# Patient Record
Sex: Female | Born: 1965
Health system: Southern US, Community
[De-identification: ages and names within clinical notes are randomized; demographics above are authoritative.]

## PROBLEM LIST (undated history)

## (undated) DIAGNOSIS — I10 Essential (primary) hypertension: Secondary | ICD-10-CM

## (undated) DIAGNOSIS — S066X9A Traumatic subarachnoid hemorrhage with loss of consciousness of unspecified duration, initial encounter: Secondary | ICD-10-CM

## (undated) DIAGNOSIS — G43909 Migraine, unspecified, not intractable, without status migrainosus: Secondary | ICD-10-CM

## (undated) DIAGNOSIS — T7840XA Allergy, unspecified, initial encounter: Secondary | ICD-10-CM

## (undated) DIAGNOSIS — I639 Cerebral infarction, unspecified: Secondary | ICD-10-CM

## (undated) DIAGNOSIS — F419 Anxiety disorder, unspecified: Secondary | ICD-10-CM

## (undated) HISTORY — DX: Migraine, unspecified, not intractable, without status migrainosus: G43.909

## (undated) HISTORY — PX: UTERINE FIBROID EMBOLIZATION: SHX825

## (undated) HISTORY — DX: Allergy, unspecified, initial encounter: T78.40XA

## (undated) HISTORY — DX: Traumatic subarachnoid hemorrhage with loss of consciousness of unspecified duration, initial encounter: S06.6X9A

## (undated) HISTORY — PX: TUBAL LIGATION: SHX77

## (undated) HISTORY — DX: Anxiety disorder, unspecified: F41.9

## (undated) HISTORY — PX: TONSILLECTOMY: SUR1361

---

## 2005-09-21 ENCOUNTER — Emergency Department: Payer: Self-pay | Admitting: Emergency Medicine

## 2005-09-22 ENCOUNTER — Other Ambulatory Visit: Payer: Self-pay

## 2005-10-28 ENCOUNTER — Ambulatory Visit: Payer: Self-pay | Admitting: Family Medicine

## 2007-04-12 ENCOUNTER — Emergency Department: Payer: Self-pay | Admitting: Emergency Medicine

## 2007-04-15 ENCOUNTER — Ambulatory Visit: Payer: Self-pay | Admitting: Family Medicine

## 2008-01-12 ENCOUNTER — Ambulatory Visit: Payer: Self-pay | Admitting: Physician Assistant

## 2008-08-23 ENCOUNTER — Ambulatory Visit: Payer: Self-pay | Admitting: Internal Medicine

## 2008-11-20 ENCOUNTER — Emergency Department: Payer: Self-pay | Admitting: Emergency Medicine

## 2008-12-19 DIAGNOSIS — J309 Allergic rhinitis, unspecified: Secondary | ICD-10-CM | POA: Insufficient documentation

## 2009-01-05 ENCOUNTER — Emergency Department: Payer: Self-pay | Admitting: Emergency Medicine

## 2009-06-19 ENCOUNTER — Ambulatory Visit: Payer: Self-pay | Admitting: Internal Medicine

## 2009-06-23 ENCOUNTER — Ambulatory Visit: Payer: Self-pay | Admitting: Internal Medicine

## 2009-07-21 ENCOUNTER — Emergency Department: Payer: Self-pay | Admitting: Emergency Medicine

## 2009-07-24 ENCOUNTER — Ambulatory Visit: Payer: Self-pay | Admitting: Internal Medicine

## 2009-08-23 ENCOUNTER — Ambulatory Visit: Payer: Self-pay | Admitting: Internal Medicine

## 2009-10-31 ENCOUNTER — Ambulatory Visit: Payer: Self-pay | Admitting: Vascular Surgery

## 2010-10-01 ENCOUNTER — Ambulatory Visit: Payer: Self-pay | Admitting: Internal Medicine

## 2010-10-24 ENCOUNTER — Ambulatory Visit: Payer: Self-pay | Admitting: Internal Medicine

## 2010-11-22 ENCOUNTER — Ambulatory Visit: Payer: Self-pay | Admitting: Internal Medicine

## 2011-02-08 ENCOUNTER — Emergency Department: Payer: Self-pay | Admitting: Emergency Medicine

## 2011-02-10 ENCOUNTER — Emergency Department: Payer: Self-pay | Admitting: Unknown Physician Specialty

## 2011-09-24 DIAGNOSIS — S066XAA Traumatic subarachnoid hemorrhage with loss of consciousness status unknown, initial encounter: Secondary | ICD-10-CM

## 2011-09-24 DIAGNOSIS — S066X9A Traumatic subarachnoid hemorrhage with loss of consciousness of unspecified duration, initial encounter: Secondary | ICD-10-CM

## 2011-09-24 HISTORY — DX: Traumatic subarachnoid hemorrhage with loss of consciousness of unspecified duration, initial encounter: S06.6X9A

## 2011-09-24 HISTORY — DX: Traumatic subarachnoid hemorrhage with loss of consciousness status unknown, initial encounter: S06.6XAA

## 2011-10-09 ENCOUNTER — Ambulatory Visit: Payer: Self-pay

## 2012-05-06 ENCOUNTER — Ambulatory Visit: Payer: Self-pay | Admitting: Family Medicine

## 2012-05-28 ENCOUNTER — Emergency Department: Payer: Self-pay | Admitting: Emergency Medicine

## 2012-05-28 LAB — CBC WITH DIFFERENTIAL/PLATELET
Basophil #: 0.1 10*3/uL (ref 0.0–0.1)
Basophil %: 0.9 %
Eosinophil #: 0.3 10*3/uL (ref 0.0–0.7)
Eosinophil %: 2.6 %
HGB: 13.4 g/dL (ref 12.0–16.0)
Lymphocyte #: 2.3 10*3/uL (ref 1.0–3.6)
Lymphocyte %: 18.1 %
MCH: 30.3 pg (ref 26.0–34.0)
MCHC: 34.4 g/dL (ref 32.0–36.0)
MCV: 88 fL (ref 80–100)
Monocyte #: 0.6 x10 3/mm (ref 0.2–0.9)
Neutrophil #: 9.2 10*3/uL — ABNORMAL HIGH (ref 1.4–6.5)
Neutrophil %: 73.7 %
Platelet: 219 10*3/uL (ref 150–440)
RBC: 4.42 10*6/uL (ref 3.80–5.20)

## 2012-05-28 LAB — BASIC METABOLIC PANEL
Anion Gap: 7 (ref 7–16)
Calcium, Total: 9 mg/dL (ref 8.5–10.1)
Chloride: 106 mmol/L (ref 98–107)
Co2: 26 mmol/L (ref 21–32)
Creatinine: 0.75 mg/dL (ref 0.60–1.30)
EGFR (African American): >60
Sodium: 139 mmol/L (ref 136–145)

## 2012-05-28 LAB — URINALYSIS, COMPLETE
Bilirubin,UR: NEGATIVE
Blood: NEGATIVE
Ketone: NEGATIVE
Nitrite: NEGATIVE
Ph: 8 (ref 4.5–8.0)
Squamous Epithelial: <1
WBC UR: 6 /HPF (ref 0–5)

## 2012-05-28 LAB — PREGNANCY, URINE: Pregnancy Test, Urine: NEGATIVE m[IU]/mL

## 2012-05-28 LAB — APTT: Activated PTT: 30.3 secs (ref 23.6–35.9)

## 2012-05-28 LAB — PROTIME-INR: Prothrombin Time: 12.9 secs (ref 11.5–14.7)

## 2012-06-11 ENCOUNTER — Emergency Department: Payer: Self-pay | Admitting: Emergency Medicine

## 2012-06-19 ENCOUNTER — Ambulatory Visit: Payer: Self-pay | Admitting: Obstetrics & Gynecology

## 2012-10-29 ENCOUNTER — Emergency Department: Payer: Self-pay | Admitting: Emergency Medicine

## 2013-05-23 ENCOUNTER — Emergency Department: Payer: Self-pay | Admitting: Emergency Medicine

## 2013-05-23 LAB — COMPREHENSIVE METABOLIC PANEL
Alkaline Phosphatase: 65 U/L (ref 50–136)
Anion Gap: 3 — ABNORMAL LOW (ref 7–16)
BUN: 6 mg/dL — ABNORMAL LOW (ref 7–18)
Bilirubin,Total: 0.5 mg/dL (ref 0.2–1.0)
Creatinine: 0.76 mg/dL (ref 0.60–1.30)
EGFR (African American): >60
Glucose: 80 mg/dL (ref 65–99)
Osmolality: 272 (ref 275–301)
SGOT(AST): 21 U/L (ref 15–37)
SGPT (ALT): 23 U/L (ref 12–78)
Total Protein: 8.1 g/dL (ref 6.4–8.2)

## 2013-05-23 LAB — CBC
HCT: 37.4 % (ref 35.0–47.0)
HGB: 13.3 g/dL (ref 12.0–16.0)
MCH: 31 pg (ref 26.0–34.0)
MCV: 87 fL (ref 80–100)
Platelet: 233 10*3/uL (ref 150–440)
RBC: 4.28 10*6/uL (ref 3.80–5.20)
RDW: 14.2 % (ref 11.5–14.5)
WBC: 9.7 10*3/uL (ref 3.6–11.0)

## 2013-05-23 LAB — URINALYSIS, COMPLETE
Glucose,UR: NEGATIVE mg/dL (ref 0–75)
Leukocyte Esterase: NEGATIVE
Ph: 5 (ref 4.5–8.0)
Specific Gravity: 1.03 (ref 1.003–1.030)
Squamous Epithelial: 4

## 2014-05-05 ENCOUNTER — Emergency Department: Payer: Self-pay | Admitting: Emergency Medicine

## 2014-05-05 LAB — URINALYSIS, COMPLETE
BILIRUBIN, UR: NEGATIVE
Bacteria: NONE SEEN
GLUCOSE, UR: NEGATIVE mg/dL (ref 0–75)
Ketone: NEGATIVE
LEUKOCYTE ESTERASE: NEGATIVE
NITRITE: NEGATIVE
Ph: 6 (ref 4.5–8.0)
Protein: 30
RBC,UR: 3 /HPF (ref 0–5)
Specific Gravity: 1.029 (ref 1.003–1.030)
Squamous Epithelial: 16
WBC UR: 3 /HPF (ref 0–5)

## 2014-05-05 LAB — GC/CHLAMYDIA PROBE AMP

## 2014-05-05 LAB — PREGNANCY, URINE: Pregnancy Test, Urine: NEGATIVE m[IU]/mL

## 2014-10-25 ENCOUNTER — Emergency Department: Payer: Self-pay | Admitting: Student

## 2015-05-01 ENCOUNTER — Ambulatory Visit: Payer: Self-pay | Admitting: Family Medicine

## 2015-05-21 ENCOUNTER — Emergency Department
Admission: EM | Admit: 2015-05-21 | Discharge: 2015-05-21 | Disposition: A | Discharge status: Home / Self Care | Payer: Medicaid Other | Attending: Emergency Medicine | Admitting: Emergency Medicine

## 2015-05-21 ENCOUNTER — Emergency Department: Payer: Medicaid Other

## 2015-05-21 ENCOUNTER — Other Ambulatory Visit: Payer: Self-pay

## 2015-05-21 DIAGNOSIS — Z87891 Personal history of nicotine dependence: Secondary | ICD-10-CM | POA: Insufficient documentation

## 2015-05-21 DIAGNOSIS — R0602 Shortness of breath: Secondary | ICD-10-CM | POA: Insufficient documentation

## 2015-05-21 DIAGNOSIS — R079 Chest pain, unspecified: Secondary | ICD-10-CM

## 2015-05-21 HISTORY — DX: Cerebral infarction, unspecified: I63.9

## 2015-05-21 LAB — BASIC METABOLIC PANEL
ANION GAP: 9 (ref 5–15)
BUN: 9 mg/dL (ref 6–20)
CHLORIDE: 102 mmol/L (ref 101–111)
CO2: 27 mmol/L (ref 22–32)
Calcium: 9.3 mg/dL (ref 8.9–10.3)
Creatinine, Ser: 0.84 mg/dL (ref 0.44–1.00)
GFR calc Af Amer: >60 mL/min (ref >60)
GFR calc non Af Amer: >60 mL/min (ref >60)
Glucose, Bld: 93 mg/dL (ref 65–99)
POTASSIUM: 3.6 mmol/L (ref 3.5–5.1)
SODIUM: 138 mmol/L (ref 135–145)

## 2015-05-21 LAB — CBC
HCT: 34.1 % — ABNORMAL LOW (ref 35.0–47.0)
HEMOGLOBIN: 11.3 g/dL — AB (ref 12.0–16.0)
MCH: 26.6 pg (ref 26.0–34.0)
MCHC: 33.3 g/dL (ref 32.0–36.0)
MCV: 79.9 fL — AB (ref 80.0–100.0)
Platelets: 222 10*3/uL (ref 150–440)
RBC: 4.27 MIL/uL (ref 3.80–5.20)
RDW: 15.5 % — ABNORMAL HIGH (ref 11.5–14.5)
WBC: 9 10*3/uL (ref 3.6–11.0)

## 2015-05-21 LAB — TROPONIN I: Troponin I: <0.03 ng/mL (ref <0.031)

## 2015-05-21 NOTE — ED Provider Notes (Signed)
[facility] Emergency Department Provider Note  ____________________________________________  Time seen: 2045  I have reviewed the triage vital signs and the nursing notes.   HISTORY  Chief Complaint Chest Pain and Shortness of Breath   History limited by: Not Limited   HPI Sandra Lin is a 49 y.o. female who presents to the emergency department today because of concerns for episodes of chest pain and shortness of breath that happened at work today. Patient tates she had sudden onset of sharp left upper chest pain whilst at work today. This was accompanied by shortness of breath. She states that the pain was sharp and did not radiate. She did have some pins and needles type feeling down her left arm at this time. She states she had 2 such episodes. She denies any current pain. She denies any recent fevers.   Past Medical History  Diagnosis Date  . Stroke     There are no active problems to display for this patient.   Past Surgical History  Procedure Laterality Date  . Tubal ligation    . Tonsillectomy    . Uterine fibroid embolization      No current outpatient prescriptions on file.  Allergies Review of patient's allergies indicates no known allergies.  History reviewed. No pertinent family history.  Social History Social History  Substance Use Topics  . Smoking status: Former Research scientist (life sciences)  . Smokeless tobacco: None  . Alcohol Use: Yes    Review of Systems  Constitutional: Negative for fever. Cardiovascular: Positive for chest pain. Respiratory: Positive for shortness of breath. Gastrointestinal: Negative for abdominal pain, vomiting and diarrhea. Genitourinary: Negative for dysuria. Musculoskeletal: Negative for back pain. Skin: Negative for rash. Neurological: Negative for headaches, focal weakness or numbness.  10-point ROS otherwise negative.  ____________________________________________   PHYSICAL EXAM:  VITAL SIGNS: ED  Triage Vitals  Enc Vitals Group     BP 05/21/15 2033 217/97 mmHg     Pulse Rate 05/21/15 2033 63     Resp --      Temp 05/21/15 2033 98.4 F (36.9 C)     Temp Source 05/21/15 2033 Oral     SpO2 05/21/15 2033 100 %     Weight 05/21/15 2033 186 lb (84.369 kg)     Height 05/21/15 2033 5\' 5"  (1.651 m)     Head Cir --      Peak Flow --      Pain Score 05/21/15 2034 0   Constitutional: Alert and oriented. Well appearing and in no distress. Eyes: Conjunctivae are normal. PERRL. Normal extraocular movements. ENT   Head: Normocephalic and atraumatic.   Nose: No congestion/rhinnorhea.   Mouth/Throat: Mucous membranes are moist.   Neck: No stridor. Hematological/Lymphatic/Immunilogical: No cervical lymphadenopathy. Cardiovascular: Normal rate, regular rhythm.  No murmurs, rubs, or gallops. Respiratory: Normal respiratory effort without tachypnea nor retractions. Breath sounds are clear and equal bilaterally. No wheezes/rales/rhonchi. Gastrointestinal: Soft and nontender. No distention. There is no CVA tenderness. Genitourinary: Deferred Musculoskeletal: Normal range of motion in all extremities. No joint effusions.  No lower extremity tenderness nor edema. Neurologic:  Normal speech and language. No gross focal neurologic deficits are appreciated. Speech is normal.  Skin:  Skin is warm, dry and intact. No rash noted. Psychiatric: Mood and affect are normal. Speech and behavior are normal. Patient exhibits appropriate insight and judgment.  ____________________________________________    LABS (pertinent positives/negatives)  Labs Reviewed  CBC - Abnormal; Notable for the following:    Hemoglobin 11.3 (*)  HCT 34.1 (*)    MCV 79.9 (*)    RDW 15.5 (*)    All other components within normal limits  BASIC METABOLIC PANEL  TROPONIN I     ____________________________________________   EKG  I, Nance Pear, attending physician, personally viewed and interpreted this  EKG  EKG Time: 2037 Rate: 66 Rhythm: NSr Axis: normal Intervals: qtc 459 QRS: narrow ST changes: no st elevation ____________________________________________    RADIOLOGY  Chest x-ray IMPRESSION: No active cardiopulmonary disease. ____________________________________________   PROCEDURES  Procedure(s) performed: None  Critical Care performed: No  ____________________________________________   INITIAL IMPRESSION / ASSESSMENT AND PLAN / ED COURSE  Pertinent labs & imaging results that were available during my care of the patient were reviewed by me and considered in my medical decision making (see chart for details).  Patient presented to the emergency room today because of concerns for episode of chest pain shortness breath. On exam patient is no acute distress. Blood work without any concerning findings. EKG without any ST elevations. Heart score of 2. At this time I feel patient is low risk and safe for discharge with primary care follow-up. Discussed return precautions with patient.  ____________________________________________   FINAL CLINICAL IMPRESSION(S) / ED DIAGNOSES  Final diagnoses:  Chest pain, unspecified chest pain type     Nance Pear, MD 05/21/15 2316

## 2015-05-21 NOTE — ED Notes (Signed)
Pt reports sudden onset of intermittent SOB and chest pain with consistent tingling in the left arm that radiates to the hand.

## 2015-05-21 NOTE — Discharge Instructions (Signed)
Please seek medical attention for any high fevers, chest pain, shortness of breath, change in behavior, persistent vomiting, bloody stool or any other new or concerning symptoms. ° °Chest Pain (Nonspecific) °It is often hard to give a specific diagnosis for the cause of chest pain. There is always a chance that your pain could be related to something serious, such as a heart attack or a blood clot in the lungs. You need to follow up with your health care provider for further evaluation. °CAUSES  °· Heartburn. °· Pneumonia or bronchitis. °· Anxiety or stress. °· Inflammation around your heart (pericarditis) or lung (pleuritis or pleurisy). °· A blood clot in the lung. °· A collapsed lung (pneumothorax). It can develop suddenly on its own (spontaneous pneumothorax) or from trauma to the chest. °· Shingles infection (herpes zoster virus). °The chest wall is composed of bones, muscles, and cartilage. Any of these can be the source of the pain. °· The bones can be bruised by injury. °· The muscles or cartilage can be strained by coughing or overwork. °· The cartilage can be affected by inflammation and become sore (costochondritis). °DIAGNOSIS  °Lab tests or other studies may be needed to find the cause of your pain. Your health care provider may have you take a test called an ambulatory electrocardiogram (ECG). An ECG records your heartbeat patterns over a 24-hour period. You may also have other tests, such as: °· Transthoracic echocardiogram (TTE). During echocardiography, sound waves are used to evaluate how blood flows through your heart. °· Transesophageal echocardiogram (TEE). °· Cardiac monitoring. This allows your health care provider to monitor your heart rate and rhythm in real time. °· Holter monitor. This is a portable device that records your heartbeat and can help diagnose heart arrhythmias. It allows your health care provider to track your heart activity for several days, if needed. °· Stress tests by  exercise or by giving medicine that makes the heart beat faster. °TREATMENT  °· Treatment depends on what may be causing your chest pain. Treatment may include: °¨ Acid blockers for heartburn. °¨ Anti-inflammatory medicine. °¨ Pain medicine for inflammatory conditions. °¨ Antibiotics if an infection is present. °· You may be advised to change lifestyle habits. This includes stopping smoking and avoiding alcohol, caffeine, and chocolate. °· You may be advised to keep your head raised (elevated) when sleeping. This reduces the chance of acid going backward from your stomach into your esophagus. °Most of the time, nonspecific chest pain will improve within 2-3 days with rest and mild pain medicine.  °HOME CARE INSTRUCTIONS  °· If antibiotics were prescribed, take them as directed. Finish them even if you start to feel better. °· For the next few days, avoid physical activities that bring on chest pain. Continue physical activities as directed. °· Do not use any tobacco products, including cigarettes, chewing tobacco, or electronic cigarettes. °· Avoid drinking alcohol. °· Only take medicine as directed by your health care provider. °· Follow your health care provider's suggestions for further testing if your chest pain does not go away. °· Keep any follow-up appointments you made. If you do not go to an appointment, you could develop lasting (chronic) problems with pain. If there is any problem keeping an appointment, call to reschedule. °SEEK MEDICAL CARE IF:  °· Your chest pain does not go away, even after treatment. °· You have a rash with blisters on your chest. °· You have a fever. °SEEK IMMEDIATE MEDICAL CARE IF:  °· You have increased chest   pain or pain that spreads to your arm, neck, jaw, back, or abdomen. °· You have shortness of breath. °· You have an increasing cough, or you cough up blood. °· You have severe back or abdominal pain. °· You feel nauseous or vomit. °· You have severe weakness. °· You  faint. °· You have chills. °This is an emergency. Do not wait to see if the pain will go away. Get medical help at once. Call your local emergency services (911 in [place] Do not drive yourself to the hospital. °MAKE SURE YOU:  °· Understand these instructions. °· Will watch your condition. °· Will get help right away if you are not doing well or get worse. °Document Released: 06/19/2005 Document Revised: 09/14/2013 Document Reviewed: 04/14/2008 °Exit[employer]® Patient Information ©2015 [employer]. This information is not intended to replace advice given to you by your health care provider. Make sure you discuss any questions you have with your health care provider. ° °

## 2015-08-14 ENCOUNTER — Other Ambulatory Visit: Payer: Self-pay | Admitting: Physician Assistant

## 2015-08-14 DIAGNOSIS — Z1231 Encounter for screening mammogram for malignant neoplasm of breast: Secondary | ICD-10-CM

## 2015-09-04 ENCOUNTER — Ambulatory Visit
Admission: RE | Admit: 2015-09-04 | Discharge: 2015-09-04 | Disposition: A | Discharge status: Home / Self Care | Payer: Medicaid Other | Source: Ambulatory Visit | Attending: Physician Assistant | Admitting: Physician Assistant

## 2015-09-04 DIAGNOSIS — Z1231 Encounter for screening mammogram for malignant neoplasm of breast: Secondary | ICD-10-CM | POA: Insufficient documentation

## 2015-09-19 ENCOUNTER — Emergency Department: Payer: Medicaid Other

## 2015-09-19 ENCOUNTER — Emergency Department
Admission: EM | Admit: 2015-09-19 | Discharge: 2015-09-19 | Disposition: A | Discharge status: Home / Self Care | Payer: Medicaid Other | Attending: Emergency Medicine | Admitting: Emergency Medicine

## 2015-09-19 DIAGNOSIS — Z3202 Encounter for pregnancy test, result negative: Secondary | ICD-10-CM | POA: Insufficient documentation

## 2015-09-19 DIAGNOSIS — R1110 Vomiting, unspecified: Secondary | ICD-10-CM

## 2015-09-19 DIAGNOSIS — I10 Essential (primary) hypertension: Secondary | ICD-10-CM | POA: Insufficient documentation

## 2015-09-19 DIAGNOSIS — R05 Cough: Secondary | ICD-10-CM | POA: Insufficient documentation

## 2015-09-19 DIAGNOSIS — R112 Nausea with vomiting, unspecified: Secondary | ICD-10-CM | POA: Insufficient documentation

## 2015-09-19 DIAGNOSIS — Z87891 Personal history of nicotine dependence: Secondary | ICD-10-CM | POA: Insufficient documentation

## 2015-09-19 DIAGNOSIS — R059 Cough, unspecified: Secondary | ICD-10-CM

## 2015-09-19 DIAGNOSIS — R197 Diarrhea, unspecified: Secondary | ICD-10-CM | POA: Insufficient documentation

## 2015-09-19 DIAGNOSIS — R109 Unspecified abdominal pain: Secondary | ICD-10-CM | POA: Insufficient documentation

## 2015-09-19 DIAGNOSIS — R1033 Periumbilical pain: Secondary | ICD-10-CM | POA: Insufficient documentation

## 2015-09-19 HISTORY — DX: Essential (primary) hypertension: I10

## 2015-09-19 LAB — CBC
HCT: 34.9 % — ABNORMAL LOW (ref 35.0–47.0)
Hemoglobin: 11.2 g/dL — ABNORMAL LOW (ref 12.0–16.0)
MCH: 24.8 pg — AB (ref 26.0–34.0)
MCHC: 32.2 g/dL (ref 32.0–36.0)
MCV: 77 fL — ABNORMAL LOW (ref 80.0–100.0)
PLATELETS: 272 10*3/uL (ref 150–440)
RBC: 4.54 MIL/uL (ref 3.80–5.20)
RDW: 16.3 % — ABNORMAL HIGH (ref 11.5–14.5)
WBC: 8.9 10*3/uL (ref 3.6–11.0)

## 2015-09-19 LAB — URINALYSIS COMPLETE WITH MICROSCOPIC (ARMC ONLY)
BACTERIA UA: NONE SEEN
Bilirubin Urine: NEGATIVE
Glucose, UA: NEGATIVE mg/dL
Hgb urine dipstick: NEGATIVE
KETONES UR: NEGATIVE mg/dL
Leukocytes, UA: NEGATIVE
Nitrite: NEGATIVE
PH: 6 (ref 5.0–8.0)
PROTEIN: NEGATIVE mg/dL
SPECIFIC GRAVITY, URINE: 1.02 (ref 1.005–1.030)

## 2015-09-19 LAB — COMPREHENSIVE METABOLIC PANEL
ALBUMIN: 4 g/dL (ref 3.5–5.0)
ALK PHOS: 51 U/L (ref 38–126)
ALT: 19 U/L (ref 14–54)
AST: 15 U/L (ref 15–41)
Anion gap: 7 (ref 5–15)
BUN: 12 mg/dL (ref 6–20)
CALCIUM: 9.7 mg/dL (ref 8.9–10.3)
CO2: 30 mmol/L (ref 22–32)
CREATININE: 0.84 mg/dL (ref 0.44–1.00)
Chloride: 103 mmol/L (ref 101–111)
GFR calc Af Amer: >60 mL/min (ref >60)
GFR calc non Af Amer: >60 mL/min (ref >60)
GLUCOSE: 92 mg/dL (ref 65–99)
Potassium: 4.1 mmol/L (ref 3.5–5.1)
SODIUM: 140 mmol/L (ref 135–145)
Total Bilirubin: 0.5 mg/dL (ref 0.3–1.2)
Total Protein: 8.4 g/dL — ABNORMAL HIGH (ref 6.5–8.1)

## 2015-09-19 LAB — POCT PREGNANCY, URINE: Preg Test, Ur: NEGATIVE

## 2015-09-19 LAB — LIPASE, BLOOD: Lipase: 34 U/L (ref 11–51)

## 2015-09-19 NOTE — ED Provider Notes (Signed)
[facility] Emergency Department Provider Note  ____________________________________________   I have reviewed the triage vital signs and the nursing notes.   HISTORY  Chief Complaint Abdominal Pain    HPI Sandra Lin is a 49 y.o. female presents today complaining of coughing abdominal pain and vomiting. Patient states that she has had a cough which is actually greatly improved but sometimes he has paroxysms of significant coughing. Several days ago she had 1 episode of coughing and she shortly thereafter developed a transient pain in the right periumbilical abdominal region which passed very rapidly. She was fine until today and then today she had one other episode of coughing and she had a similar pain which lasted very briefly. She states she coughs so hard she threw up. Nonetheless she states the cough is nonproductive coughing fits her much more rare she is not otherwise short of breath has not a fever her other URI symptoms have all started to go away and she feels much better. She was concerned however that she had some pain in her stomach again with coughing she wanted to make sure she didn't tear something or something was not otherwise amiss At this time she has no pain complaint  Past Medical History  Diagnosis Date  . Stroke ([place])   . Hypertension     There are no active problems to display for this patient.   Past Surgical History  Procedure Laterality Date  . Tubal ligation    . Tonsillectomy    . Uterine fibroid embolization      No current outpatient prescriptions on file.  Allergies Shellfish allergy  Family History  Problem Relation Age of Onset  . Breast cancer Neg Hx     Social History Social History  Substance Use Topics  . Smoking status: Former Research scientist (life sciences)  . Smokeless tobacco: None  . Alcohol Use: Yes    Review of Systems Constitutional: No fever/chills Eyes: No visual changes. ENT: No sore throat. No stiff neck no  neck pain Cardiovascular: Denies chest pain. Respiratory: Denies shortness of breath. See history of present illness regarding cough Gastrointestinal:   History of present illness regarding vomiting No diarrhea.  No constipation. Genitourinary: Negative for dysuria. Musculoskeletal: Negative lower extremity swelling Skin: Negative for rash. Neurological: Negative for headaches, focal weakness or numbness. 10-point ROS otherwise negative.  ____________________________________________   PHYSICAL EXAM:  VITAL SIGNS: ED Triage Vitals  Enc Vitals Group     BP 09/19/15 1512 156/94 mmHg     Pulse Rate 09/19/15 1512 68     Resp 09/19/15 1512 18     Temp 09/19/15 1512 98.1 F (36.7 C)     Temp Source 09/19/15 1512 Oral     SpO2 09/19/15 1512 100 %     Weight 09/19/15 1512 180 lb (81.647 kg)     Height 09/19/15 1512 5\' 5"  (1.651 m)     Head Cir --      Peak Flow --      Pain Score 09/19/15 1513 3     Pain Loc --      Pain Edu? --      Excl. in [place]? --     Constitutional: Alert and oriented. Well appearing and in no acute distress. Eyes: Conjunctivae are normal. PERRL. EOMI. Head: Atraumatic. Nose: No congestion/rhinnorhea. Mouth/Throat: Mucous membranes are moist.  Oropharynx non-erythematous. Neck: No stridor.   Nontender with no meningismus Cardiovascular: Normal rate, regular rhythm. Grossly normal heart sounds.  Good peripheral circulation.  Respiratory: Normal respiratory effort.  No retractions. Lungs CTAB. Abdominal: Soft and nontender. No distention. No guarding no rebound Back:  There is no focal tenderness or step off there is no midline tenderness there are no lesions noted. there is no CVA tenderness Musculoskeletal: No lower extremity tenderness. No joint effusions, no DVT signs strong distal pulses no edema Neurologic:  Normal speech and language. No gross focal neurologic deficits are appreciated.  Skin:  Skin is warm, dry and intact. No rash noted. Psychiatric:  Mood and affect are normal. Speech and behavior are normal.  ____________________________________________   LABS (all labs ordered are listed, but only abnormal results are displayed)  Labs Reviewed  COMPREHENSIVE METABOLIC PANEL - Abnormal; Notable for the following:    Total Protein 8.4 (*)    All other components within normal limits  CBC - Abnormal; Notable for the following:    Hemoglobin 11.2 (*)    HCT 34.9 (*)    MCV 77.0 (*)    MCH 24.8 (*)    RDW 16.3 (*)    All other components within normal limits  URINALYSIS COMPLETEWITH MICROSCOPIC ([facility] ONLY) - Abnormal; Notable for the following:    Color, Urine YELLOW (*)    APPearance CLEAR (*)    Squamous Epithelial / LPF 0-5 (*)    All other components within normal limits  LIPASE, BLOOD  POC URINE PREG, ED  POCT PREGNANCY, URINE   ____________________________________________  EKG  I personally interpreted any EKGs ordered by me or triage  ____________________________________________  RADIOLOGY  I reviewed any imaging ordered by me or triage that were performed during my shift ____________________________________________   PROCEDURES  Procedure(s) performed: None  Critical Care performed: None  ____________________________________________   INITIAL IMPRESSION / ASSESSMENT AND PLAN / ED COURSE  Pertinent labs & imaging results that were available during my care of the patient were reviewed by me and considered in my medical decision making (see chart for details).  Patient with serial negative abdominal exams, no evidence of intra-abdominal pathology blood work is reassuring*is reassuring percent is reassuring she has no symptoms at this time she would like a work note. Etc. return precautions and follow-up given and understood. Do not think antibiotics are warranted for this cough is no evidence of pneumonia and her sats are good. She has no respiratory distress or wheeze and she feels it is improving. Do  not think that the patient has appendicitis do not think that the patient has any other significant intra-abdominal pathology at this time. Return precautions and follow-up have been given and understood. ____________________________________________   FINAL CLINICAL IMPRESSION(S) / ED DIAGNOSES  Final diagnoses:  Abdominal pain, vomiting, and diarrhea     Schuyler Amor, MD 09/19/15 1806

## 2015-09-19 NOTE — ED Notes (Signed)
Pt c/o intermittent RUQ pain for the past 4 days, N/V for the past 2 days.Marland Kitchen

## 2015-09-19 NOTE — Discharge Instructions (Signed)

## 2016-01-15 ENCOUNTER — Encounter: Payer: Self-pay | Admitting: Emergency Medicine

## 2016-01-15 ENCOUNTER — Emergency Department
Admission: EM | Admit: 2016-01-15 | Discharge: 2016-01-15 | Disposition: A | Discharge status: Home / Self Care | Payer: Medicaid Other | Attending: Emergency Medicine | Admitting: Emergency Medicine

## 2016-01-15 DIAGNOSIS — L03113 Cellulitis of right upper limb: Secondary | ICD-10-CM

## 2016-01-15 DIAGNOSIS — Z8673 Personal history of transient ischemic attack (TIA), and cerebral infarction without residual deficits: Secondary | ICD-10-CM | POA: Insufficient documentation

## 2016-01-15 DIAGNOSIS — I10 Essential (primary) hypertension: Secondary | ICD-10-CM | POA: Insufficient documentation

## 2016-01-15 DIAGNOSIS — Z87891 Personal history of nicotine dependence: Secondary | ICD-10-CM | POA: Insufficient documentation

## 2016-01-15 DIAGNOSIS — L539 Erythematous condition, unspecified: Secondary | ICD-10-CM | POA: Insufficient documentation

## 2016-01-15 LAB — CBC WITH DIFFERENTIAL/PLATELET
BASOS ABS: 0 10*3/uL (ref 0–0.1)
Basophils Relative: 0 %
EOS ABS: 0.3 10*3/uL (ref 0–0.7)
EOS PCT: 5 %
HCT: 32.2 % — ABNORMAL LOW (ref 35.0–47.0)
HEMOGLOBIN: 10.5 g/dL — AB (ref 12.0–16.0)
LYMPHS ABS: 1.6 10*3/uL (ref 1.0–3.6)
LYMPHS PCT: 22 %
MCH: 24.8 pg — ABNORMAL LOW (ref 26.0–34.0)
MCHC: 32.5 g/dL (ref 32.0–36.0)
MCV: 76.3 fL — AB (ref 80.0–100.0)
Monocytes Absolute: 0.4 10*3/uL (ref 0.2–0.9)
Monocytes Relative: 6 %
NEUTROS PCT: 67 %
Neutro Abs: 4.9 10*3/uL (ref 1.4–6.5)
PLATELETS: 203 10*3/uL (ref 150–440)
RBC: 4.23 MIL/uL (ref 3.80–5.20)
RDW: 16.2 % — AB (ref 11.5–14.5)
WBC: 7.3 10*3/uL (ref 3.6–11.0)

## 2016-01-15 LAB — COMPREHENSIVE METABOLIC PANEL
ALT: 14 U/L (ref 14–54)
ANION GAP: 7 (ref 5–15)
AST: 14 U/L — AB (ref 15–41)
Albumin: 3.7 g/dL (ref 3.5–5.0)
Alkaline Phosphatase: 46 U/L (ref 38–126)
BILIRUBIN TOTAL: 0.3 mg/dL (ref 0.3–1.2)
BUN: 10 mg/dL (ref 6–20)
CHLORIDE: 106 mmol/L (ref 101–111)
CO2: 26 mmol/L (ref 22–32)
Calcium: 8.8 mg/dL — ABNORMAL LOW (ref 8.9–10.3)
Creatinine, Ser: 0.67 mg/dL (ref 0.44–1.00)
Glucose, Bld: 124 mg/dL — ABNORMAL HIGH (ref 65–99)
POTASSIUM: 3.4 mmol/L — AB (ref 3.5–5.1)
Sodium: 139 mmol/L (ref 135–145)
TOTAL PROTEIN: 7.3 g/dL (ref 6.5–8.1)

## 2016-01-15 MED ORDER — CLINDAMYCIN HCL 150 MG PO CAPS
300.0000 mg | ORAL_CAPSULE | Freq: Once | ORAL | Status: DC
  Filled 2016-01-15: qty 2

## 2016-01-15 MED ORDER — CLINDAMYCIN PHOSPHATE 600 MG/50ML IV SOLN: 600.0000 mg | Freq: Once | INTRAVENOUS | Status: DC

## 2016-01-15 MED ORDER — CLINDAMYCIN HCL 300 MG PO CAPS: 300.0000 mg | ORAL_CAPSULE | Freq: Three times a day (TID) | ORAL | Status: DC

## 2016-01-15 NOTE — ED Notes (Signed)
Pt not in room when this nurse went in for discharge. Called pt's cell phone number and left message that she forgot her paperwork.

## 2016-01-15 NOTE — ED Notes (Signed)
States she noticed 2 small areas to right arm couple of days ago  Right arm red and swollen   Positive pulses and good sensation

## 2016-01-15 NOTE — ED Notes (Signed)
Attempt x 1 for IV x1 without success.  Pt refused further IV attempts, states she is scared of needles.  Pt also refused IM shot for antibiotic.  Pt given capsules and states she cant swallow them.  Spoke with Hank from pharmacy who states it is ok to sprinkle the capsules in apple sauce.  Pt put apple sauce in her mouth then spit it in the sink, states "its too nasty".  MD aware.

## 2016-01-15 NOTE — Discharge Instructions (Signed)
Cellulitis Cellulitis is an infection of the skin and the tissue beneath it. The infected area is usually red and tender. Cellulitis occurs most often in the arms and lower legs.  CAUSES  Cellulitis is caused by bacteria that enter the skin through cracks or cuts in the skin. The most common types of bacteria that cause cellulitis are staphylococci and streptococci. SIGNS AND SYMPTOMS   Redness and warmth.  Swelling.  Tenderness or pain.  Fever. DIAGNOSIS  Your health care provider can usually determine what is wrong based on a physical exam. Blood tests may also be done. TREATMENT  Treatment usually involves taking an antibiotic medicine. HOME CARE INSTRUCTIONS   Take your antibiotic medicine as directed by your health care provider. Finish the antibiotic even if you start to feel better.  Keep the infected arm or leg elevated to reduce swelling.  Apply a warm cloth to the affected area up to 4 times per day to relieve pain.  Take medicines only as directed by your health care provider.  Keep all follow-up visits as directed by your health care provider. SEEK MEDICAL CARE IF:   You notice red streaks coming from the infected area.  Your red area gets larger or turns dark in color.  Your bone or joint underneath the infected area becomes painful after the skin has healed.  Your infection returns in the same area or another area.  You notice a swollen bump in the infected area.  You develop new symptoms.  You have a fever. SEEK IMMEDIATE MEDICAL CARE IF:   You feel very sleepy.  You develop vomiting or diarrhea.  You have a general ill feeling (malaise) with muscle aches and pains.  The swelling spreads or you cannot move your elbow  High fever  Any sign of allergic reaction such as trouble breathing or diffuse hives or throat swelling,  You feel worse in any way.   This information is not intended to replace advice given to you by your health care provider.  Make sure you discuss any questions you have with your health care provider.   Document Released: 06/19/2005 Document Revised: 05/31/2015 Document Reviewed: 11/25/2011 Elsevier Interactive Patient Education [employer].

## 2016-01-15 NOTE — ED Notes (Signed)
Pt had 2 "knots" in her right arm just above elbow 4 days ago, and swelling in her right arm started 2 days ago. Arm is red, swollen, warm to touch.

## 2016-01-15 NOTE — ED Provider Notes (Addendum)
[facility] Emergency Department Provider Note  ____________________________________________   I have reviewed the triage vital signs and the nursing notes.   HISTORY  Chief Complaint Arm Swelling    HPI Sandra Lin is a 50 y.o. female who states that Pt had 2 "knots" in her right arm just above elbow 4 days ago, and swelling in her right arm started 2 days ago. Arm is red, swollen, warm to touch. She has no fever or chills or systemic illness. She states that she has no difficulty ranging the elbow. She states it is mildly air H either but she denies any itching or hives anywhere else and she denies any chest pain shortness of breath or throat closure or other symptoms of allergic reaction.     Past Medical History  Diagnosis Date  . Stroke ([place])   . Hypertension     There are no active problems to display for this patient.   Past Surgical History  Procedure Laterality Date  . Tubal ligation    . Tonsillectomy    . Uterine fibroid embolization      No current outpatient prescriptions on file.  Allergies Shellfish allergy  Family History  Problem Relation Age of Onset  . Breast cancer Neg Hx     Social History Social History  Substance Use Topics  . Smoking status: Former Research scientist (life sciences)  . Smokeless tobacco: None  . Alcohol Use: Yes     Comment: occasional    Review of Systems Constitutional: No fever/chills Musculoskeletal: Negative lower extremity swelling Skin:See above Neurological: Negative for headaches, focal weakness or numbness. 10-point ROS otherwise negative.  ____________________________________________   PHYSICAL EXAM:  VITAL SIGNS: ED Triage Vitals  Enc Vitals Group     BP 01/15/16 1127 189/100 mmHg     Pulse Rate 01/15/16 1127 66     Resp 01/15/16 1127 20     Temp 01/15/16 1127 98.4 F (36.9 C)     Temp Source 01/15/16 1127 Oral     SpO2 01/15/16 1127 100 %     Weight 01/15/16 1127 187 lb (84.823  kg)     Height 01/15/16 1127 5\' 5"  (1.651 m)     Head Cir --      Peak Flow --      Pain Score --      Pain Loc --      Pain Edu? --      Excl. in [place]? --     Constitutional: Alert and oriented. Well appearing and in no acute distress. Eyes: Conjunctivae are normal. PERRL. EOMI. Head: Atraumatic. Nose: No congestion/rhinnorhea. Mouth/Throat: Mucous membranes are moist.  Oropharynx non-erythematous. Neck: No stridor.   Nontender with no meningismus Cardiovascular: Normal rate, regular rhythm. Grossly normal heart sounds.  Good peripheral circulation. Respiratory: Normal respiratory effort.  No retractions. Lungs CTAB. Abdominal: Soft and nontender. No distention. No guarding no rebound Back:  There is no focal tenderness or step off there is no midline tenderness there are no lesions noted. there is no CVA tenderness Musculoskeletal: No lower extremity tenderness. No joint effusions, no DVT signs strong distal pulses no edema Neurologic:  Normal speech and language. No gross focal neurologic deficits are appreciated.  Skin:  There is erythema and warmth noted in the localized area around the right elbow, full painless range of motion of the elbow itself, no abscess or induration or fluctuance noted. No "bumps" evidence of ongoing folliculitis noted. Psychiatric: Mood and affect are normal. Speech and  behavior are normal.  ____________________________________________   LABS (all labs ordered are listed, but only abnormal results are displayed)  Labs Reviewed  CBC WITH DIFFERENTIAL/PLATELET - Abnormal; Notable for the following:    Hemoglobin 10.5 (*)    HCT 32.2 (*)    MCV 76.3 (*)    MCH 24.8 (*)    RDW 16.2 (*)    All other components within normal limits  COMPREHENSIVE METABOLIC PANEL - Abnormal; Notable for the following:    Potassium 3.4 (*)    Glucose, Bld 124 (*)    Calcium 8.8 (*)    AST 14 (*)    All other components within normal limits    ____________________________________________  EKG  I personally interpreted any EKGs ordered by me or triage  ____________________________________________  RADIOLOGY  I reviewed any imaging ordered by me or triage that were performed during my shift and, if possible, patient and/or family made aware of any abnormal findings. ____________________________________________   PROCEDURES  Procedure(s) performed: None  Critical Care performed: None  ____________________________________________   INITIAL IMPRESSION / ASSESSMENT AND PLAN / ED COURSE  Pertinent labs & imaging results that were available during my care of the patient were reviewed by me and considered in my medical decision making (see chart for details).  Patient with what appears to be a cellulitis to the right upper extremity, could also be an isolated allergic reaction with no evidence of anaphylaxis however, most consistent with cellulitis we'll treat her with MRSA coverage with clindamycin extensive return precautions and follow-up given and understood. Blood pressure is mildly elevated here however, patient is asymptomatic from that we have advised that she get it rechecked by her primary care doctor in 2 days.  ----------------------------------------- 2:05 PM on 01/15/2016 -----------------------------------------  The patient declined IV access for clindamycin she would prefer to take calcium sounds limitations as well as positive, we will treat her orally however there is no evidence of systemic illness.  ----------------------------------------- 2:44 PM on 01/15/2016 -----------------------------------------  The patient refuses to take IV or IM clindamycin because she is afraid of needles, she does not take pills apparently, we tried giving her in capsule form struggled on applesauce but tasted bad so she spat it out,. We are seeing if we can get her some oral preparation of the liquid variety from the  pharmacy.  Patient understands she is being significant limitations on her ability to help her by refusing to take medications because it taste bad or they are pill form or they are shots ____________________________________________   FINAL CLINICAL IMPRESSION(S) / ED DIAGNOSES  Final diagnoses:  None      This chart was dictated using voice recognition software.  Despite best efforts to proofread,  errors can occur which can change meaning.     Schuyler Amor, MD 01/15/16 [place], MD 01/15/16 [place], MD 01/15/16 [phone]

## 2016-01-16 DIAGNOSIS — N926 Irregular menstruation, unspecified: Secondary | ICD-10-CM | POA: Insufficient documentation

## 2016-01-16 DIAGNOSIS — E6609 Other obesity due to excess calories: Secondary | ICD-10-CM | POA: Insufficient documentation

## 2016-01-16 DIAGNOSIS — Z8673 Personal history of transient ischemic attack (TIA), and cerebral infarction without residual deficits: Secondary | ICD-10-CM | POA: Insufficient documentation

## 2016-01-16 DIAGNOSIS — Z862 Personal history of diseases of the blood and blood-forming organs and certain disorders involving the immune mechanism: Secondary | ICD-10-CM | POA: Insufficient documentation

## 2016-01-16 DIAGNOSIS — E785 Hyperlipidemia, unspecified: Secondary | ICD-10-CM | POA: Insufficient documentation

## 2016-01-16 DIAGNOSIS — Z91013 Allergy to seafood: Secondary | ICD-10-CM | POA: Insufficient documentation

## 2016-01-16 DIAGNOSIS — N393 Stress incontinence (female) (male): Secondary | ICD-10-CM | POA: Insufficient documentation

## 2016-01-16 DIAGNOSIS — D219 Benign neoplasm of connective and other soft tissue, unspecified: Secondary | ICD-10-CM | POA: Insufficient documentation

## 2016-01-16 DIAGNOSIS — Z6831 Body mass index (BMI) 31.0-31.9, adult: Secondary | ICD-10-CM

## 2016-01-16 DIAGNOSIS — D1720 Benign lipomatous neoplasm of skin and subcutaneous tissue of unspecified limb: Secondary | ICD-10-CM | POA: Insufficient documentation

## 2016-01-29 ENCOUNTER — Ambulatory Visit: Payer: Medicaid Other | Attending: Oncology | Admitting: *Deleted

## 2016-01-29 VITALS — BP 173/114 | HR 58 | Temp 96.7°F

## 2016-01-29 DIAGNOSIS — N63 Unspecified lump in unspecified breast: Secondary | ICD-10-CM

## 2016-01-29 NOTE — Patient Instructions (Signed)
Gave patient hand-out, Women Staying Healthy, Active and Well from [facility], with education on breast health, pap smears, heart and colon health. 

## 2016-01-29 NOTE — Progress Notes (Signed)
Subjective:     Patient ID: Sandra Lin, female   DOB: [birth year], 50 y.o.   MRN: [id number]  HPI   Review of Systems     Objective:   Physical Exam  Pulmonary/Chest: Right breast exhibits no inverted nipple, no mass, no nipple discharge, no skin change and no tenderness. Left breast exhibits no inverted nipple, no mass, no nipple discharge, no skin change and no tenderness. Breasts are symmetrical.    Large pendulous breast       Assessment:     50 year old Black female referred to [facility] by Nat Christen, PA-C at the [facility] for a 1x4 cm palpable mass in the left breast at 1:00.  Patient states she found the mass and presented to the clinic on 01/04/16.  States about 2 days ago she noticed that the nodule had completely disappeared.  On clinical breast exam there is no dominant mass, skin changes, nipple discharge or lymphadenopathy.  Taught self breast awareness.  Patient has been screened for eligibility.  She does not have any insurance, Medicare or Medicaid.  She also meets financial eligibility.  Hand-out given on the Affordable Care Act.     Plan:     Since the area of concern has completely resolved I don't see a need for additional imaging at this time.  Last mammogram was a birads 1 on 09/04/15.  Informed patient that mammography was not a 100% accurate.  Stressed importance of calling back if she notices any changes.  Appointment scheduled to return to [facility] on 09/04/16 for annual screening unless she needed to be seen earlier.  She is agreeable.

## 2016-01-30 ENCOUNTER — Encounter: Payer: Self-pay | Admitting: *Deleted

## 2016-01-30 NOTE — Progress Notes (Signed)
Patient to return in December for annual exam and mammogram.  [facility] to [place].

## 2016-03-13 ENCOUNTER — Emergency Department
Admission: EM | Admit: 2016-03-13 | Discharge: 2016-03-13 | Disposition: A | Discharge status: Home / Self Care | Payer: Medicaid Other | Attending: Emergency Medicine | Admitting: Emergency Medicine

## 2016-03-13 ENCOUNTER — Encounter: Payer: Self-pay | Admitting: Emergency Medicine

## 2016-03-13 DIAGNOSIS — Y939 Activity, unspecified: Secondary | ICD-10-CM | POA: Insufficient documentation

## 2016-03-13 DIAGNOSIS — Y929 Unspecified place or not applicable: Secondary | ICD-10-CM | POA: Insufficient documentation

## 2016-03-13 DIAGNOSIS — Y999 Unspecified external cause status: Secondary | ICD-10-CM | POA: Insufficient documentation

## 2016-03-13 DIAGNOSIS — Z79899 Other long term (current) drug therapy: Secondary | ICD-10-CM | POA: Insufficient documentation

## 2016-03-13 DIAGNOSIS — X58XXXA Exposure to other specified factors, initial encounter: Secondary | ICD-10-CM | POA: Insufficient documentation

## 2016-03-13 DIAGNOSIS — Z91013 Allergy to seafood: Secondary | ICD-10-CM | POA: Insufficient documentation

## 2016-03-13 DIAGNOSIS — T192XXA Foreign body in vulva and vagina, initial encounter: Secondary | ICD-10-CM | POA: Insufficient documentation

## 2016-03-13 DIAGNOSIS — Z8673 Personal history of transient ischemic attack (TIA), and cerebral infarction without residual deficits: Secondary | ICD-10-CM | POA: Insufficient documentation

## 2016-03-13 DIAGNOSIS — Z87891 Personal history of nicotine dependence: Secondary | ICD-10-CM | POA: Insufficient documentation

## 2016-03-13 DIAGNOSIS — Z792 Long term (current) use of antibiotics: Secondary | ICD-10-CM | POA: Insufficient documentation

## 2016-03-13 DIAGNOSIS — I10 Essential (primary) hypertension: Secondary | ICD-10-CM | POA: Insufficient documentation

## 2016-03-13 NOTE — ED Notes (Signed)
Dr. Brown at bedside

## 2016-03-13 NOTE — ED Notes (Signed)
Condom extracted by Dr. Owens Shark during pelvic

## 2016-03-13 NOTE — ED Provider Notes (Signed)
[facility] Emergency Department Provider Note  ____________________________________________  Time seen: 2:30 AM  I have reviewed the triage vital signs and the nursing notes.   HISTORY  Chief Complaint Foreign Body in Vagina      HPI Sandra Lin is a 50 y.o. female presents with inability to move a condom from the vaginal vault. Patient states that the condom has been in the vagina since "earlier tonight".    Past Medical History  Diagnosis Date  . Stroke ([place])   . Hypertension     Patient Active Problem List   Diagnosis Date Noted  . Allergic to shellfish 01/16/2016  . Dyslipidemia 01/16/2016  . Cerebrovascular accident, old 01/16/2016  . H/O iron deficiency anemia 01/16/2016  . Irregular bleeding 01/16/2016  . Lipoma of lower extremity 01/16/2016  . Adiposity 01/16/2016  . Fibroid 01/16/2016  . Stress incontinence 01/16/2016  . Allergic rhinitis 12/19/2008    Past Surgical History  Procedure Laterality Date  . Tubal ligation    . Tonsillectomy    . Uterine fibroid embolization      Current Outpatient Rx  Name  Route  Sig  Dispense  Refill  . hydrochlorothiazide (MICROZIDE) 12.5 MG capsule   Oral   Take 12.5 mg by mouth daily.         . clindamycin (CLEOCIN) 300 MG capsule   Oral   Take 1 capsule (300 mg total) by mouth 3 (three) times daily.   30 capsule   0     Allergies Dust mite extract; Molds & smuts; and Shellfish allergy  Family History  Problem Relation Age of Onset  . Breast cancer Neg Hx     Social History Social History  Substance Use Topics  . Smoking status: Former Research scientist (life sciences)  . Smokeless tobacco: None  . Alcohol Use: Yes     Comment: occasional    Review of Systems  Constitutional: Negative for fever. Eyes: Negative for visual changes. ENT: Negative for sore throat. Cardiovascular: Negative for chest pain. Respiratory: Negative for shortness of breath. Gastrointestinal: Negative for  abdominal pain, vomiting and diarrhea. Genitourinary: Negative for dysuria.Positive for condom the vagina Musculoskeletal: Negative for back pain. Skin: Negative for rash. Neurological: Negative for headaches, focal weakness or numbness.  10-point ROS otherwise negative.  ____________________________________________   PHYSICAL EXAM:  VITAL SIGNS: ED Triage Vitals  Enc Vitals Group     BP 03/13/16 0053 176/104 mmHg     Pulse Rate 03/13/16 0053 67     Resp 03/13/16 0053 18     Temp 03/13/16 0053 98.1 F (36.7 C)     Temp Source 03/13/16 0053 Oral     SpO2 03/13/16 0053 99 %     Weight 03/13/16 0053 188 lb (85.276 kg)     Height 03/13/16 0053 5\' 5"  (1.651 m)     Head Cir --      Peak Flow --      Pain Score 03/13/16 0052 0     Pain Loc --      Pain Edu? --      Excl. in [place]? --      Constitutional: Alert and oriented. Well appearing and in no distress. Eyes: Conjunctivae are normal. PERRL. Normal extraocular movements. ENT   Head: Normocephalic and atraumatic.   Nose: No congestion/rhinnorhea.   Mouth/Throat: Mucous membranes are moist.   Neck: No stridor. Gastrointestinal: Soft and nontender. No distention. There is no CVA tenderness. Genitourinary: Condom removed from the vaginal vault Skin:  Skin is warm, dry and intact. No rash noted. Psychiatric: Mood and affect are normal. Speech and behavior are normal. Patient exhibits appropriate insight and judgment.     INITIAL IMPRESSION / ASSESSMENT AND PLAN / ED COURSE  Pertinent labs & imaging results that were available during my care of the patient were reviewed by me and considered in my medical decision making (see chart for details).  Condom removed from the vaginal vault with ring forceps.  ____________________________________________   FINAL CLINICAL IMPRESSION(S) / ED DIAGNOSES  Final diagnoses:  Vaginal foreign body, initial encounter      Gregor Hams, MD 03/13/16 [phone]

## 2016-03-13 NOTE — Discharge Instructions (Signed)
Vaginal Foreign Body °A vaginal foreign body is any object that gets stuck or left inside the vagina. Vaginal foreign bodies left in the vagina for a long time can cause irritation and infection. In most cases, symptoms go away once the vaginal foreign body is found and removed. Rarely, a foreign object can break through the walls of the vagina and cause a serious infection inside the abdomen.  °CAUSES  °The most common vaginal foreign bodies are: °· Tampons. °· Contraceptive devices. °· Toilet tissue left in the vagina. °· Small objects that were placed in the vagina out of curiosity and got stuck. °· A result of sexual abuse.  °SIGNS AND SYMPTOMS °· Light vaginal bleeding. °· Blood-tinged vaginal fluid (discharge). °· Vaginal discharge that smells bad. °· Vaginal itching or burning. °· Redness, swelling, or rash near the opening of the vagina. °· Abdominal pain. °· Fever. °· Burning or frequent urination. °DIAGNOSIS  °Your health care provider may be able to diagnose a vaginal foreign body based on the information you provide, your symptoms, and a physical exam. Your health care provider may also perform the following tests to check for infection: °· A swab of the discharge to check under a microscope for bacteria (culture). °· A urine culture. °· An examination of the vagina with a small, lighted scope (vaginoscopy). °· Imaging tests to get a picture of the inside of your vagina, such as: °¨ Ultrasound. °¨ X-ray. °¨ MRI. °TREATMENT  °In most cases, a vaginal foreign body can be easily removed and the symptoms usually go away very quickly. Other treatment may include:  °· If the vaginal foreign body is not easily removed, medicine may be given to make you go to sleep (general anesthesia) to have the object removed. °· Emergency surgery may be necessary if an infection spreads through the walls of the vagina into the abdomen (acute abdomen). This is rare. °· You may need to take antibiotic medicine if you have a  vaginal or urinary tract infection. °HOME CARE INSTRUCTIONS  °· Take medicines only as directed by your health care provider. °· If you were prescribed an antibiotic medicine, finish it all even if you start to feel better. °· Do not have sex or use tampons until your health care provider says it is okay. °· Do not douche or use vaginal rinses unless your health care provider recommends it. °· Keep all follow-up visits as directed by your health care provider. This is important. °SEEK MEDICAL CARE IF: °· You have abdominal pain or burning pain when urinating. °· You have a fever. °SEEK IMMEDIATE MEDICAL CARE IF: °· You have heavy vaginal bleeding or discharge.   °· You have very bad abdominal pain.   °MAKE SURE YOU: °· Understand these instructions. °· Will watch your condition. °· Will get help right away if you are not doing well or get worse. °  °This information is not intended to replace advice given to you by your health care provider. Make sure you discuss any questions you have with your health care provider. °  °Document Released: 01/24/2014 Document Reviewed: 01/24/2014 °Elsevier Interactive Patient Education ©2016 [employer] ° °

## 2016-03-13 NOTE — ED Notes (Signed)
Patient ambulatory to triage with steady gait, without difficulty or distress noted; pt reports unable to remove condom from vagina tonight

## 2016-09-04 ENCOUNTER — Ambulatory Visit: Payer: Medicaid Other

## 2016-12-11 ENCOUNTER — Ambulatory Visit
Admission: RE | Admit: 2016-12-11 | Discharge: 2016-12-11 | Disposition: A | Discharge status: Home / Self Care | Payer: Medicaid Other | Source: Ambulatory Visit | Attending: Oncology | Admitting: Oncology

## 2016-12-11 ENCOUNTER — Encounter: Payer: Self-pay | Admitting: *Deleted

## 2016-12-11 ENCOUNTER — Ambulatory Visit: Payer: Medicaid Other | Attending: Oncology | Admitting: *Deleted

## 2016-12-11 ENCOUNTER — Encounter (INDEPENDENT_AMBULATORY_CARE_PROVIDER_SITE_OTHER): Payer: Self-pay

## 2016-12-11 VITALS — BP 187/93 | HR 70 | Temp 98.6°F | Ht 66.14 in | Wt 192.0 lb

## 2016-12-11 DIAGNOSIS — Z0000 Encounter for general adult medical examination without abnormal findings: Secondary | ICD-10-CM

## 2016-12-11 NOTE — Progress Notes (Signed)
Subjective:     Patient ID: Sandra Lin, female   DOB: 1966/03/21, 51 y.o.   MRN: [id number]  HPI   Review of Systems     Objective:   Physical Exam  Pulmonary/Chest: Right breast exhibits no inverted nipple, no mass, no nipple discharge, no skin change and no tenderness. Left breast exhibits no inverted nipple, no mass, no nipple discharge, no skin change and no tenderness. Breasts are symmetrical.  Abdominal: There is no splenomegaly or hepatomegaly.  Genitourinary: No labial fusion. There is no rash, tenderness, lesion or injury on the right labia. There is no rash, tenderness, lesion or injury on the left labia. Cervix exhibits discharge. Cervix exhibits no motion tenderness and no friability. Right adnexum displays no mass, no tenderness and no fullness. Left adnexum displays no mass, no tenderness and no fullness. No erythema, tenderness or bleeding in the vagina. No foreign body in the vagina. No signs of injury around the vagina. No vaginal discharge found.  Genitourinary Comments: Odorous white discharge noted on exam       Assessment:     52 year old Black female returns to [facility] for annual screening.  Clinical breast exam unremarkable.  Taught self breast awareness.  Specimen collected for pap smear without difficulty.  Blood pressure elevated at 187/93.  She is take her meds as soon as possible and to recheck her blood pressure at [facility] or [facility], and if remains higher than 140/90 she is to follow-up with her primary care provider.  Hand out on hypertention given to patient.  Patient states she has not been taking her meds on a regular basis.  Discussed side effects of uncontrolled hypertension and her history of stroke.  States she will definitely call her doctor if it does not come down.  Patient has been screened for eligibility.  She does not have any insurance, Medicare or Medicaid.  She also meets financial eligibility.  Hand-out given on the Affordable Care Act.    Plan:      Screening mammogram ordered.  Specimen for pap smear sent to the lab.  Will follow-up per [facility] protocol.

## 2016-12-11 NOTE — Patient Instructions (Signed)
HPV Test The human papillomavirus (HPV) test is used to look for high-risk types of HPV infection. HPV is a group of about 100 viruses. Many of these viruses cause growths on, in, or around the genitals. Most HPV viruses cause infections that usually go away without treatment. However, HPV types 6, 11, 16, and 18 are considered high-risk types of HPV that can increase your risk of cancer of the cervix or anus if the infection is left untreated. An HPV test identifies the DNA (genetic) strands of the HPV infection, so it is also referred to as the HPV DNA test. Although HPV is found in both males and females, the HPV test is only used to screen for increased cancer risk in females:  With an abnormal Pap test.  After treatment of an abnormal Pap test.  Between the ages of 56 and 49.  After treatment of a high-risk HPV infection. The HPV test may be done at the same time as a pelvic exam and Pap test in females over the age of 27. Both the HPV test and Pap test require a sample of cells from the cervix. How do I prepare for this test?  Do not douche or take a bath for 24-48 hours before the test or as directed by your health care provider.  Do not have sex for 24-48 hours before the test or as directed by your health care provider.  You may be asked to reschedule the test if you are menstruating.  You will be asked to urinate before the test. What do the results mean? It is your responsibility to obtain your test results. Ask the lab or department performing the test when and how you will get your results. Talk with your health care provider if you have any questions about your results. Your result will be negative or positive. Meaning of Negative Test Results  A negative HPV test result means that no HPV was found, and it is very likely that you do not have HPV. Meaning of Positive Test Results  A positive HPV test result indicates that you have HPV.  If your test result shows the presence  of any high-risk HPV strains, you may have an increased risk of developing cancer of the cervix or anus if the infection is left untreated.  If any low-risk HPV strains are found, you are not likely to have an increased risk of cancer. Discuss your test results with your health care provider. He or she will use the results to make a diagnosis and determine a treatment plan that is right for you. Talk with your health care provider to discuss your results, treatment options, and if necessary, the need for more tests. Talk with your health care provider if you have any questions about your results. This information is not intended to replace advice given to you by your health care provider. Make sure you discuss any questions you have with your health care provider. Document Released: 10/04/2004 Document Revised: 05/15/2016 Document Reviewed: 01/25/2014 Elsevier Interactive Patient Education  2017 Elsevier Inc. Hypertension Hypertension, commonly called high blood pressure, is when the force of blood pumping through the arteries is too strong. The arteries are the blood vessels that carry blood from the heart throughout the body. Hypertension forces the heart to work harder to pump blood and may cause arteries to become narrow or stiff. Having untreated or uncontrolled hypertension can cause heart attacks, strokes, kidney disease, and other problems. A blood pressure reading consists of a  higher number over a lower number. Ideally, your blood pressure should be below 120/80. The first ("top") number is called the systolic pressure. It is a measure of the pressure in your arteries as your heart beats. The second ("bottom") number is called the diastolic pressure. It is a measure of the pressure in your arteries as the heart relaxes. What are the causes? The cause of this condition is not known. What increases the risk? Some risk factors for high blood pressure are under your control. Others are  not. Factors you can change   Smoking.  Having type 2 diabetes mellitus, high cholesterol, or both.  Not getting enough exercise or physical activity.  Being overweight.  Having too much fat, sugar, calories, or salt (sodium) in your diet.  Drinking too much alcohol. Factors that are difficult or impossible to change   Having chronic kidney disease.  Having a family history of high blood pressure.  Age. Risk increases with age.  Race. You may be at higher risk if you are African-American.  Gender. Men are at higher risk than women before age 72. After age 10, women are at higher risk than men.  Having obstructive sleep apnea.  Stress. What are the signs or symptoms? Extremely high blood pressure (hypertensive crisis) may cause:  Headache.  Anxiety.  Shortness of breath.  Nosebleed.  Nausea and vomiting.  Severe chest pain.  Jerky movements you cannot control (seizures). How is this diagnosed? This condition is diagnosed by measuring your blood pressure while you are seated, with your arm resting on a surface. The cuff of the blood pressure monitor will be placed directly against the skin of your upper arm at the level of your heart. It should be measured at least twice using the same arm. Certain conditions can cause a difference in blood pressure between your right and left arms. Certain factors can cause blood pressure readings to be lower or higher than normal (elevated) for a short period of time:  When your blood pressure is higher when you are in a health care provider's office than when you are at home, this is called white coat hypertension. Most people with this condition do not need medicines.  When your blood pressure is higher at home than when you are in a health care provider's office, this is called masked hypertension. Most people with this condition may need medicines to control blood pressure. If you have a high blood pressure reading during one  visit or you have normal blood pressure with other risk factors:  You may be asked to return on a different day to have your blood pressure checked again.  You may be asked to monitor your blood pressure at home for 1 week or longer. If you are diagnosed with hypertension, you may have other blood or imaging tests to help your health care provider understand your overall risk for other conditions. How is this treated? This condition is treated by making healthy lifestyle changes, such as eating healthy foods, exercising more, and reducing your alcohol intake. Your health care provider may prescribe medicine if lifestyle changes are not enough to get your blood pressure under control, and if:  Your systolic blood pressure is above 130.  Your diastolic blood pressure is above 80. Your personal target blood pressure may vary depending on your medical conditions, your age, and other factors. Follow these instructions at home: Eating and drinking   Eat a diet that is high in fiber and potassium, and low  in sodium, added sugar, and fat. An example eating plan is called the DASH (Dietary Approaches to Stop Hypertension) diet. To eat this way:  Eat plenty of fresh fruits and vegetables. Try to fill half of your plate at each meal with fruits and vegetables.  Eat whole grains, such as whole wheat pasta, brown rice, or whole grain bread. Fill about one quarter of your plate with whole grains.  Eat or drink low-fat dairy products, such as skim milk or low-fat yogurt.  Avoid fatty cuts of meat, processed or cured meats, and poultry with skin. Fill about one quarter of your plate with lean proteins, such as fish, chicken without skin, beans, eggs, and tofu.  Avoid premade and processed foods. These tend to be higher in sodium, added sugar, and fat.  Reduce your daily sodium intake. Most people with hypertension should eat less than 1,500 mg of sodium a day.  Limit alcohol intake to no more than 1  drink a day for nonpregnant women and 2 drinks a day for men. One drink equals 12 oz of beer, 5 oz of wine, or 1 oz of hard liquor. Lifestyle   Work with your health care provider to maintain a healthy body weight or to lose weight. Ask what an ideal weight is for you.  Get at least 30 minutes of exercise that causes your heart to beat faster (aerobic exercise) most days of the week. Activities may include walking, swimming, or biking.  Include exercise to strengthen your muscles (resistance exercise), such as pilates or lifting weights, as part of your weekly exercise routine. Try to do these types of exercises for 30 minutes at least 3 days a week.  Do not use any products that contain nicotine or tobacco, such as cigarettes and e-cigarettes. If you need help quitting, ask your health care provider.  Monitor your blood pressure at home as told by your health care provider.  Keep all follow-up visits as told by your health care provider. This is important. Medicines   Take over-the-counter and prescription medicines only as told by your health care provider. Follow directions carefully. Blood pressure medicines must be taken as prescribed.  Do not skip doses of blood pressure medicine. Doing this puts you at risk for problems and can make the medicine less effective.  Ask your health care provider about side effects or reactions to medicines that you should watch for. Contact a health care provider if:  You think you are having a reaction to a medicine you are taking.  You have headaches that keep coming back (recurring).  You feel dizzy.  You have swelling in your ankles.  You have trouble with your vision. Get help right away if:  You develop a severe headache or confusion.  You have unusual weakness or numbness.  You feel faint.  You have severe pain in your chest or abdomen.  You vomit repeatedly.  You have trouble breathing. Summary  Hypertension is when the  force of blood pumping through your arteries is too strong. If this condition is not controlled, it may put you at risk for serious complications.  Your personal target blood pressure may vary depending on your medical conditions, your age, and other factors. For most people, a normal blood pressure is less than 120/80.  Hypertension is treated with lifestyle changes, medicines, or a combination of both. Lifestyle changes include weight loss, eating a healthy, low-sodium diet, exercising more, and limiting alcohol. This information is not intended to replace  advice given to you by your health care provider. Make sure you discuss any questions you have with your health care provider. Document Released: 09/09/2005 Document Revised: 08/07/2016 Document Reviewed: 08/07/2016 Elsevier Interactive Patient Education  2017 [employer].

## 2016-12-16 LAB — PAP LB AND HPV HIGH-RISK
HPV, high-risk: POSITIVE — AB
PAP Smear Comment: 0

## 2016-12-18 ENCOUNTER — Encounter: Payer: Self-pay | Admitting: *Deleted

## 2016-12-18 NOTE — Progress Notes (Signed)
Letter mailed to inform patient of her normal mammogram and HPV positive pap smear.  She is to follow up on 12/17/17 @ 9:00 for her next mammogram and pap smear.  HSIS to [place].

## 2017-11-25 ENCOUNTER — Encounter: Payer: Self-pay | Admitting: Emergency Medicine

## 2017-11-25 ENCOUNTER — Other Ambulatory Visit: Payer: Self-pay

## 2017-11-25 ENCOUNTER — Emergency Department
Admission: EM | Admit: 2017-11-25 | Discharge: 2017-11-25 | Disposition: A | Discharge status: Home / Self Care | Payer: 59 | Attending: Emergency Medicine | Admitting: Emergency Medicine

## 2017-11-25 DIAGNOSIS — Z8673 Personal history of transient ischemic attack (TIA), and cerebral infarction without residual deficits: Secondary | ICD-10-CM | POA: Diagnosis not present

## 2017-11-25 DIAGNOSIS — Z79899 Other long term (current) drug therapy: Secondary | ICD-10-CM | POA: Diagnosis not present

## 2017-11-25 DIAGNOSIS — Z87891 Personal history of nicotine dependence: Secondary | ICD-10-CM | POA: Diagnosis not present

## 2017-11-25 DIAGNOSIS — L0231 Cutaneous abscess of buttock: Secondary | ICD-10-CM | POA: Diagnosis not present

## 2017-11-25 DIAGNOSIS — I10 Essential (primary) hypertension: Secondary | ICD-10-CM | POA: Diagnosis not present

## 2017-11-25 MED ORDER — LIDOCAINE HCL (PF) 1 % IJ SOLN
INTRAMUSCULAR | Status: AC
  Filled 2017-11-25: qty 5

## 2017-11-25 MED ORDER — CLINDAMYCIN PALMITATE HCL 75 MG/5ML PO SOLR
300.0000 mg | Freq: Once | ORAL | Status: AC
  Administered 2017-11-25: 300 mg via ORAL
  Filled 2017-11-25: qty 20

## 2017-11-25 MED ORDER — CLINDAMYCIN PALMITATE HCL 75 MG/5ML PO SOLR: 300.0000 mg | Freq: Three times a day (TID) | ORAL | 0 refills | Status: AC

## 2017-11-25 MED ORDER — LIDOCAINE HCL 1 % IJ SOLN
5.0000 mL | Freq: Once | INTRAMUSCULAR | Status: AC
  Administered 2017-11-25: 5 mL

## 2017-11-25 NOTE — ED Provider Notes (Signed)
[facility] Emergency Department Provider Note  ____________________________________________  Time seen: Approximately 11:30 PM  I have reviewed the triage vital signs and the nursing notes.   HISTORY  Chief Complaint Abscess and Chills    HPI Sandra Lin is a 52 y.o. female presents to the emergency department with a right posterior thigh abscess with 6 cm of circumferential cellulitis.  Patient reports noticing symptoms for the past 3 days.  She has tried warm compresses and soaks, which has not relieved symptoms.  She has no history of cutaneous abscesses.  She has had chills but no fever noted at home.  No nausea or vomiting.   Past Medical History:  Diagnosis Date  . Hypertension   . Stroke [facility])     Patient Active Problem List   Diagnosis Date Noted  . Allergic to shellfish 01/16/2016  . Dyslipidemia 01/16/2016  . Cerebrovascular accident, old 01/16/2016  . H/O iron deficiency anemia 01/16/2016  . Irregular bleeding 01/16/2016  . Lipoma of lower extremity 01/16/2016  . Adiposity 01/16/2016  . Fibroid 01/16/2016  . Stress incontinence 01/16/2016  . Allergic rhinitis 12/19/2008    Past Surgical History:  Procedure Laterality Date  . TONSILLECTOMY    . TUBAL LIGATION    . UTERINE FIBROID EMBOLIZATION      Prior to Admission medications   Medication Sig Start Date End Date Taking? Authorizing Provider  clindamycin (CLEOCIN) 300 MG capsule Take 1 capsule (300 mg total) by mouth 3 (three) times daily. 01/15/16   Schuyler Amor, MD  clindamycin (CLEOCIN) 75 MG/5ML solution Take 20 mLs (300 mg total) by mouth 3 (three) times daily for 10 days. 11/25/17 12/05/17  Lannie Fields, PA-C  hydrochlorothiazide (MICROZIDE) 12.5 MG capsule Take 12.5 mg by mouth daily.    [provider]    Allergies Dust mite extract; Molds & smuts; and Shellfish allergy  Family History  Problem Relation Age of Onset  . Breast cancer Paternal Aunt      Social History Social History   Tobacco Use  . Smoking status: Former Research scientist (life sciences)  . Smokeless tobacco: Never Used  Substance Use Topics  . Alcohol use: Yes    Comment: occasional  . Drug use: No     Review of Systems  Constitutional: No fever/chills Eyes: No visual changes. No discharge ENT: No upper respiratory complaints. Cardiovascular: no chest pain. Respiratory: no cough. No SOB. Musculoskeletal: Negative for musculoskeletal pain. Skin: Patient has right posterior thigh abscess. Neurological: Negative for headaches, focal weakness or numbness.   ____________________________________________   PHYSICAL EXAM:  VITAL SIGNS: ED Triage Vitals  Enc Vitals Group     BP 11/25/17 2235 133/62     Pulse Rate 11/25/17 2235 72     Resp 11/25/17 2235 18     Temp 11/25/17 2235 98.3 F (36.8 C)     Temp Source 11/25/17 2235 Oral     SpO2 11/25/17 2235 100 %     Weight 11/25/17 2237 197 lb (89.4 kg)     Height 11/25/17 2237 5\' 4"  (1.626 m)     Head Circumference --      Peak Flow --      Pain Score 11/25/17 2237 3     Pain Loc --      Pain Edu? --      Excl. in [place]? --      Constitutional: Alert and oriented. Well appearing and in no acute distress. Eyes: Conjunctivae are normal. PERRL. EOMI. Head:  Atraumatic. Cardiovascular: Normal rate, regular rhythm. Normal S1 and S2.  Good peripheral circulation. Respiratory: Normal respiratory effort without tachypnea or retractions. Lungs CTAB. Good air entry to the bases with no decreased or absent breath sounds. Musculoskeletal: Full range of motion to all extremities. No gross deformities appreciated. Neurologic:  Normal speech and language. No gross focal neurologic deficits are appreciated.  Skin: Patient has right posterior thigh abscess with 6 cm of surrounding cellulitis.   ____________________________________________   LABS (all labs ordered are listed, but only abnormal results are displayed)  Labs Reviewed - No  data to display ____________________________________________  EKG   ____________________________________________  RADIOLOGY   No results found.  ____________________________________________    PROCEDURES  Procedure(s) performed:    Procedures  INCISION AND DRAINAGE Performed by: Lannie Fields Consent: Verbal consent obtained. Risks and benefits: risks, benefits and alternatives were discussed Type: abscess  Body area: Right posterior thigh.   Anesthesia: local infiltration  Incision was made with a scalpel.  Local anesthetic: lidocaine 1% without epinephrine  Anesthetic total: 3 ml  Complexity: complex Blunt dissection to break up loculations  Drainage: purulent  Drainage amount: Copious  Packing material: 1/4 in iodoform gauze  Patient tolerance: Patient tolerated the procedure well with no immediate complications.      Medications  lidocaine (XYLOCAINE) 1 % (with pres) injection 5 mL (not administered)  lidocaine (PF) (XYLOCAINE) 1 % injection (not administered)  clindamycin (CLEOCIN) 75 MG/5ML solution 300 mg (not administered)     ____________________________________________   INITIAL IMPRESSION / ASSESSMENT AND PLAN / ED COURSE  Pertinent labs & imaging results that were available during my care of the patient were reviewed by me and considered in my medical decision making (see chart for details).  Review of the [place] CSRS was performed in accordance of the [place] prior to dispensing any controlled drugs.     Assessment and plan Abscess Patient presents to the emergency department with a right posterior thigh abscess with 6 cm of surrounding cellulitis.  Patient underwent incision and drainage in the emergency department without complication.  She was given clindamycin in the emergency department.  She was discharged with clindamycin.  She was advised to have packing removed by primary care in 3 days.  Vital signs were reassuring prior to  discharge.  All patient questions were answered.     ____________________________________________  FINAL CLINICAL IMPRESSION(S) / ED DIAGNOSES  Final diagnoses:  Abscess of buttock, right      NEW MEDICATIONS STARTED DURING THIS VISIT:  ED Discharge Orders        Ordered    clindamycin (CLEOCIN) 75 MG/5ML solution  3 times daily     11/25/17 2315          This chart was dictated using voice recognition software/Dragon. Despite best efforts to proofread, errors can occur which can change the meaning. Any change was purely unintentional.    Lannie Fields, PA-C 11/25/17 [place], [place], MD 11/26/17 [phone]

## 2017-11-25 NOTE — ED Triage Notes (Signed)
Pt presents to ED with abscess to her right thigh. Pt first noticed affected area 4 days ago. Swelling and pain worsening.

## 2017-11-26 ENCOUNTER — Ambulatory Visit: Payer: Self-pay

## 2017-12-17 ENCOUNTER — Ambulatory Visit
Admission: RE | Admit: 2017-12-17 | Discharge: 2017-12-17 | Disposition: A | Discharge status: Home / Self Care | Payer: 59 | Source: Ambulatory Visit | Attending: Oncology | Admitting: Oncology

## 2017-12-17 ENCOUNTER — Ambulatory Visit: Payer: 59 | Attending: Oncology

## 2017-12-17 ENCOUNTER — Ambulatory Visit: Payer: Medicaid Other

## 2017-12-17 VITALS — BP 146/83 | HR 69 | Temp 98.3°F | Ht 66.0 in | Wt 192.0 lb

## 2017-12-17 DIAGNOSIS — Z0000 Encounter for general adult medical examination without abnormal findings: Secondary | ICD-10-CM

## 2017-12-17 DIAGNOSIS — Z1231 Encounter for screening mammogram for malignant neoplasm of breast: Secondary | ICD-10-CM | POA: Diagnosis not present

## 2017-12-17 NOTE — Patient Instructions (Signed)
HPV Test The human papillomavirus (HPV) test is used to look for high-risk types of HPV infection. HPV is a group of about 100 viruses. Many of these viruses cause growths on, in, or around the genitals. Most HPV viruses cause infections that usually go away without treatment. However, HPV types 6, 11, 16, and 18 are considered high-risk types of HPV that can increase your risk of cancer of the cervix or anus if the infection is left untreated. An HPV test identifies the DNA (genetic) strands of the HPV infection, so it is also referred to as the HPV DNA test. Although HPV is found in both males and females, the HPV test is only used to screen for increased cancer risk in females:  With an abnormal Pap test.  After treatment of an abnormal Pap test.  Between the ages of 30 and 65.  After treatment of a high-risk HPV infection.  The HPV test may be done at the same time as a pelvic exam and Pap test in females over the age of 30. Both the HPV test and Pap test require a sample of cells from the cervix. How do I prepare for this test?  Do not douche or take a bath for 24-48 hours before the test or as directed by your health care provider.  Do not have sex for 24-48 hours before the test or as directed by your health care provider.  You may be asked to reschedule the test if you are menstruating.  You will be asked to urinate before the test. What do the results mean? It is your responsibility to obtain your test results. Ask the lab or department performing the test when and how you will get your results. Talk with your health care provider if you have any questions about your results. Your result will be negative or positive. Meaning of Negative Test Results A negative HPV test result means that no HPV was found, and it is very likely that you do not have HPV. Meaning of Positive Test Results A positive HPV test result indicates that you have HPV.  If your test result shows the presence  of any high-risk HPV strains, you may have an increased risk of developing cancer of the cervix or anus if the infection is left untreated.  If any low-risk HPV strains are found, you are not likely to have an increased risk of cancer.  Discuss your test results with your health care provider. He or she will use the results to make a diagnosis and determine a treatment plan that is right for you. Talk with your health care provider to discuss your results, treatment options, and if necessary, the need for more tests. Talk with your health care provider if you have any questions about your results. This information is not intended to replace advice given to you by your health care provider. Make sure you discuss any questions you have with your health care provider. Document Released: 10/04/2004 Document Revised: 05/15/2016 Document Reviewed: 01/25/2014 Elsevier Interactive Patient Education  2018 Elsevier Inc.  

## 2017-12-17 NOTE — Progress Notes (Signed)
Subjective:     Patient ID: Sandra Lin, female   DOB: 1966/07/09, 52 y.o.   MRN: [id number]  HPI   Review of Systems     Objective:   Physical Exam  Pulmonary/Chest: Right breast exhibits no inverted nipple, no mass, no nipple discharge, no skin change and no tenderness. Left breast exhibits no inverted nipple, no mass, no nipple discharge, no skin change and no tenderness. Breasts are asymmetrical.  Left breast smaller than right  Genitourinary: No labial fusion. There is no rash, tenderness, lesion or injury on the right labia. There is no rash, tenderness, lesion or injury on the left labia. Uterus is not deviated, not enlarged, not fixed and not tender. Cervix exhibits no motion tenderness and no discharge. Right adnexum displays no mass, no tenderness and no fullness. Left adnexum displays no mass, no tenderness and no fullness. No erythema, tenderness or bleeding in the vagina. No foreign body in the vagina. No signs of injury around the vagina. Vaginal discharge found.  Genitourinary Comments: Thick white non-odorous  Vaginal discharge       Assessment:  52 year old patient presents for [facility] clinic visit.  Patient screened, and meets BCCCP eligibility.  Patient does not have insurance, Medicare or Medicaid.  Handout given on Affordable Care Act. Instructed patient on breast self awareness using teach back method.  Patient reports at the time she made appointment, she felt a lump in her left breast, but it has gone.  States her menstrual cycles have been irregular. Thinks she  Is perimenopausal.  CBE unremarkable.  No mass or lump palpated.  Pap last year negative /HPV positive.  Pelvic exam normal.      Plan:     Sent for bilateral screening mammogram.  Specimen collected for pap.

## 2017-12-18 NOTE — Progress Notes (Unsigned)
Letter mailed from [facility] to notify of normal mammogram results.  Patient to return in one year for annual screening.  Pap results pending. 

## 2017-12-21 LAB — PAP LB AND HPV HIGH-RISK
HPV, high-risk: POSITIVE — AB
PAP Smear Comment: 0

## 2017-12-23 NOTE — Progress Notes (Addendum)
Pap results HPV positive.  Phoned patient to give results, and plan to schedule GYN consult for colposcopy.  No answer.  Left message for patient to call back.  Patient called back and requested call after 3pm on 12/23/17.  Multiple calls with busy signal. Will try tomorrow.  Mailed patient a letter requesting a call back to schedule follow-up with GYN.

## 2018-06-10 ENCOUNTER — Telehealth: Payer: Self-pay

## 2018-06-10 NOTE — Telephone Encounter (Signed)
Copied from [place] [phone]. Topic: Appointment Scheduling - Scheduling Inquiry for Clinic >> Jun 10, 2018 12:31 PM Burchel, Abbi R wrote: Pt requesting New Pt Appt with Dr Ancil Boozer on 07/29/18 before or after her mother Lamonte Richer).  If approved by Dr Ancil Boozer, please call pt to schedule.   Pt: [phone]

## 2018-06-11 NOTE — Telephone Encounter (Signed)
Called [phone] was not able to leave voice message. unfortnately Dr Ancil Boozer is completely booked for new patient appt. There may be some openings with Raquel Sarna but nothing with Dr Ancil Boozer.

## 2018-07-29 ENCOUNTER — Other Ambulatory Visit (HOSPITAL_COMMUNITY)
Admission: RE | Admit: 2018-07-29 | Discharge: 2018-07-29 | Disposition: A | Discharge status: Home / Self Care | Payer: 59 | Source: Ambulatory Visit | Attending: Family Medicine | Admitting: Family Medicine

## 2018-07-29 ENCOUNTER — Ambulatory Visit (INDEPENDENT_AMBULATORY_CARE_PROVIDER_SITE_OTHER): Payer: 59 | Admitting: Family Medicine

## 2018-07-29 ENCOUNTER — Encounter: Payer: Self-pay | Admitting: Family Medicine

## 2018-07-29 VITALS — BP 118/72 | HR 84 | Temp 97.9°F | Resp 16 | Ht 66.0 in | Wt 193.0 lb

## 2018-07-29 DIAGNOSIS — E785 Hyperlipidemia, unspecified: Secondary | ICD-10-CM

## 2018-07-29 DIAGNOSIS — H9193 Unspecified hearing loss, bilateral: Secondary | ICD-10-CM

## 2018-07-29 DIAGNOSIS — R011 Cardiac murmur, unspecified: Secondary | ICD-10-CM | POA: Diagnosis not present

## 2018-07-29 DIAGNOSIS — Z114 Encounter for screening for human immunodeficiency virus [HIV]: Secondary | ICD-10-CM

## 2018-07-29 DIAGNOSIS — I10 Essential (primary) hypertension: Secondary | ICD-10-CM

## 2018-07-29 DIAGNOSIS — R87810 Cervical high risk human papillomavirus (HPV) DNA test positive: Secondary | ICD-10-CM | POA: Diagnosis not present

## 2018-07-29 DIAGNOSIS — Z113 Encounter for screening for infections with a predominantly sexual mode of transmission: Secondary | ICD-10-CM

## 2018-07-29 DIAGNOSIS — Z8679 Personal history of other diseases of the circulatory system: Secondary | ICD-10-CM

## 2018-07-29 MED ORDER — HYDROCHLOROTHIAZIDE 25 MG PO TABS
25.0000 mg | ORAL_TABLET | Freq: Every day | ORAL | 1 refills | Status: DC
Start: 2018-07-29 — End: 2018-10-30

## 2018-07-29 NOTE — Progress Notes (Signed)
Name: Sandra Lin   MRN: [id number]    DOB: 01/29/1966   Date:07/29/2018       Progress Note  Subjective  Chief Complaint  Chief Complaint  Patient presents with  . Establish Care  . Hearing Problem  . Heart Problem  . Genital Warts    HPV noted on pap at [facility] 2018 and 2019    HPI  PT presents to establish care and for the following:  HPV positive, negative Pap 12/17/2017 and 12/11/2016.  Pap was negative both times. No history of genital warts. No family or personal history of cervical cancers. She is sexually active; she is getting married on 08/04/18.  They were both tested for HIV about 2 years ago.  We will check STI's today.  Hearing loss - She has been noticing decreased hearing for 1-2 years, L>R. Denies pain in ears, or dizziness.  Does endorese intermittent ringing - L>R. Denies difficulty conversing in crowds with surrounding noise. Her children have complained that she keeps the TV very loud, she notes that sometimes voices sound "muted" or "quiet".  She declines ENT referral today.  HTN: BP is well controlled on HCTZ 25mg . She is normally 120's/80's. Endorses occasional chest pain on the LEFT side of her chest with some palpitations.  Denies headaches, shortness of breath, BLE edema, no vision changes.   Heart Murmur: Was being seen by Philis Pique and was sent to [facility] for evaluation - had EKG but could not afford the echo at that time.  She denies chest pain, shortness of breath. Does have history HLD - we will check today.  She took statin in the past and had some yellowing of the sclera - took herself off of this medication.   History CVA (Aneurysm)/Memory Issues: Occurred in September 2013.  She has been stable since then. Does not see neurology any more. She does note some short-term memory loss on occasion that has worsened over the last 1-2 years, but does not have any other residual effects. Mother's parents both had dementia in their 44's/80's.  6-CIT has  score of 9 - we will refer to neurology.   6CIT Screen 07/29/2018  What Year? 0 points  What month? 0 points  What time? 3 points  Count back from 20 0 points  Months in reverse 0 points  Repeat phrase 6 points  Total Score 9   Obesity: She is not exercising right now; eating mostly fast food, home cooked meal 1-2 times a week. Discussed nutrition and exercise at length. Body mass index is 31.15 kg/m.  Patient Active Problem List   Diagnosis Date Noted  . Allergic to shellfish 01/16/2016  . Dyslipidemia 01/16/2016  . Cerebrovascular accident, old 01/16/2016  . H/O iron deficiency anemia 01/16/2016  . Irregular bleeding 01/16/2016  . Lipoma of lower extremity 01/16/2016  . Adiposity 01/16/2016  . Fibroid 01/16/2016  . Stress incontinence 01/16/2016  . Allergic rhinitis 12/19/2008    Past Surgical History:  Procedure Laterality Date  . TONSILLECTOMY    . TUBAL LIGATION    . UTERINE FIBROID EMBOLIZATION      Family History  Problem Relation Age of Onset  . Breast cancer Paternal Aunt   . Hypertension Mother     Social History   Socioeconomic History  . Marital status: Single    Spouse name: Not on file  . Number of children: 5  . Years of education: Not on file  . Highest education level: Not on  file  Occupational History  . Not on file  Social Needs  . Financial resource strain: Not hard at all  . Food insecurity:    Worry: Never true    Inability: Never true  . Transportation needs:    Medical: No    Non-medical: No  Tobacco Use  . Smoking status: Former Research scientist (life sciences)  . Smokeless tobacco: Never Used  Substance and Sexual Activity  . Alcohol use: Yes    Comment: occasional  . Drug use: No  . Sexual activity: Not Currently  Lifestyle  . Physical activity:    Days per week: 0 days    Minutes per session: 0 min  . Stress: Not at all  Relationships  . Social connections:    Talks on phone: More than three times a week    Gets together: More than three  times a week    Attends religious service: More than 4 times per year    Active member of club or organization: Yes    Attends meetings of clubs or organizations: Never    Relationship status: Never married  . Intimate partner violence:    Fear of current or ex partner: No    Emotionally abused: No    Physically abused: No    Forced sexual activity: No  Other Topics Concern  . Not on file  Social History Narrative  . Not on file     Current Outpatient Medications:  .  Apoaequorin (PREVAGEN PO), Take by mouth., Disp: , Rfl:  .  hydrochlorothiazide (HYDRODIURIL) 25 MG tablet, Take 25 mg by mouth daily., Disp: , Rfl:  .  clindamycin (CLEOCIN) 300 MG capsule, Take 1 capsule (300 mg total) by mouth 3 (three) times daily. (Patient not taking: Reported on 07/29/2018), Disp: 30 capsule, Rfl: 0  Allergies  Allergen Reactions  . Dust Mite Extract   . Molds & Smuts   . Shellfish Allergy Swelling    And rash    I personally reviewed active problem list, medication list, allergies, family history, social history, health maintenance, lab results with the patient/caregiver today.   ROS  Ten systems reviewed and is negative except as mentioned in HPI  Objective  Vitals:   07/29/18 1038  BP: 118/72  Pulse: 84  Resp: 16  Temp: 97.9 F (36.6 C)  TempSrc: Oral  SpO2: 96%  Weight: 193 lb (87.5 kg)  Height: 5\' 6"  (1.676 m)   Body mass index is 31.15 kg/m.  Physical Exam  Constitutional: Patient appears well-developed and well-nourished. No distress.  HENT: Head: Normocephalic and atraumatic. Ears: bilateral TMs with no erythema or effusion; Nose: Nose normal. Mouth/Throat: Oropharynx is clear and moist. No oropharyngeal exudate or tonsillar swelling.  Eyes: Conjunctivae and EOM are normal. No scleral icterus.  Pupils are equal, round, and reactive to light.  Neck: Normal range of motion. Neck supple. No JVD present. No thyromegaly present.  Cardiovascular: Normal rate, regular  rhythm and normal heart sounds.  No murmur heard. No BLE edema. Pulmonary/Chest: Effort normal and breath sounds normal. No respiratory distress. Abdominal: Soft. Bowel sounds are normal, no distension. There is no tenderness. No masses. Musculoskeletal: Normal range of motion, no joint effusions. No gross deformities Neurological: Pt is alert and oriented to person, place, and time. No cranial nerve deficit. Coordination, balance, strength, speech and gait are normal. See above regarding 6CIT testing. Skin: Skin is warm and dry. No rash noted. No erythema.  Psychiatric: Patient has a normal mood and affect. behavior is  normal. Judgment and thought content normal.  No results found for this or any previous visit (from the past 72 hour(s)).  PHQ2/9: Depression screen PHQ 2/9 07/29/2018  Decreased Interest 0  Down, Depressed, Hopeless 0  PHQ - 2 Score 0  Altered sleeping 0  Tired, decreased energy 0  Change in appetite 0  Feeling bad or failure about yourself  0  Trouble concentrating 0  Moving slowly or fidgety/restless 0  Suicidal thoughts 0  PHQ-9 Score 0  Difficult doing work/chores Not difficult at all   Fall Risk: Fall Risk  07/29/2018  Falls in the past year? 0     Assessment & Plan  1. Cervical high risk human papillomavirus (HPV) DNA test positive - Repeat at 1 year (March 2020) - PAP was negative; HPV high risk was positive.  2. Bilateral hearing loss, unspecified hearing loss type - Declines referral to audiology/ENT.  She is being referred to neuro for memory loss.  3. Essential hypertension - DASH diet discussed in detail; well controlled today, CMP per orders. - hydrochlorothiazide (HYDRODIURIL) 25 MG tablet; Take 1 tablet (25 mg total) by mouth daily.  Dispense: 90 tablet; Refill: 1 - Lipid panel  5. Heart murmur - Lipid panel - COMPLETE METABOLIC PANEL WITH GFR - CBC w/Diff/Platelet - Ambulatory referral to Cardiology  6. History of aneurysm -  Ambulatory referral to Neurology  7. Dyslipidemia - Lipid panel  8. Morbid obesity ([place]) - Lipid panel - TSH - Discussed importance of 150 minutes of physical activity weekly, eat two servings of fish weekly, eat one serving of tree nuts ( cashews, pistachios, pecans, almonds.Marland Kitchen) every other day, eat 6 servings of fruit/vegetables daily and drink plenty of water and avoid sweet beverages.   9. Routine screening for STI (sexually transmitted infection) - Cervicovaginal ancillary only - HIV Antibody (routine testing w rflx) - RPR  10. Screening for HIV without presence of risk factors - HIV Antibody (routine testing w rflx)

## 2018-07-30 LAB — CBC WITH DIFFERENTIAL/PLATELET
BASOS ABS: 41 {cells}/uL (ref 0–200)
BASOS PCT: 0.6 %
EOS ABS: 221 {cells}/uL (ref 15–500)
Eosinophils Relative: 3.2 %
HEMATOCRIT: 38.2 % (ref 35.0–45.0)
HEMOGLOBIN: 13.4 g/dL (ref 11.7–15.5)
LYMPHS ABS: 2332 {cells}/uL (ref 850–3900)
MCH: 30.2 pg (ref 27.0–33.0)
MCHC: 35.1 g/dL (ref 32.0–36.0)
MCV: 86 fL (ref 80.0–100.0)
MPV: 11.9 fL (ref 7.5–12.5)
Monocytes Relative: 6.4 %
NEUTROS ABS: 3864 {cells}/uL (ref 1500–7800)
Neutrophils Relative %: 56 %
Platelets: 255 10*3/uL (ref 140–400)
RBC: 4.44 10*6/uL (ref 3.80–5.10)
RDW: 13.3 % (ref 11.0–15.0)
Total Lymphocyte: 33.8 %
WBC mixed population: 442 cells/uL (ref 200–950)
WBC: 6.9 10*3/uL (ref 3.8–10.8)

## 2018-07-30 LAB — COMPLETE METABOLIC PANEL WITH GFR
AG RATIO: 1.3 (calc) (ref 1.0–2.5)
ALT: 20 U/L (ref 6–29)
AST: 18 U/L (ref 10–35)
Albumin: 4.3 g/dL (ref 3.6–5.1)
Alkaline phosphatase (APISO): 58 U/L (ref 33–115)
BUN: 15 mg/dL (ref 7–25)
CALCIUM: 9.7 mg/dL (ref 8.6–10.2)
CO2: 29 mmol/L (ref 20–32)
Chloride: 103 mmol/L (ref 98–110)
Creat: 0.79 mg/dL (ref 0.50–1.10)
GFR, EST NON AFRICAN AMERICAN: 88 mL/min/{1.73_m2} (ref >=60)
GFR, Est African American: 102 mL/min/{1.73_m2} (ref >=60)
Globulin: 3.2 g/dL (calc) (ref 1.9–3.7)
Glucose, Bld: 92 mg/dL (ref 65–139)
POTASSIUM: 3.8 mmol/L (ref 3.5–5.3)
Sodium: 140 mmol/L (ref 135–146)
Total Bilirubin: 0.5 mg/dL (ref 0.2–1.2)
Total Protein: 7.5 g/dL (ref 6.1–8.1)

## 2018-07-30 LAB — LIPID PANEL
CHOL/HDL RATIO: 3.9 (calc) (ref <5.0)
Cholesterol: 239 mg/dL — ABNORMAL HIGH (ref <200)
HDL: 62 mg/dL (ref >50)
LDL Cholesterol (Calc): 154 mg/dL (calc) — ABNORMAL HIGH
NON-HDL CHOLESTEROL (CALC): 177 mg/dL — AB (ref <130)
Triglycerides: 109 mg/dL (ref <150)

## 2018-07-30 LAB — CERVICOVAGINAL ANCILLARY ONLY
CHLAMYDIA, DNA PROBE: NEGATIVE
NEISSERIA GONORRHEA: NEGATIVE

## 2018-07-30 LAB — HIV ANTIBODY (ROUTINE TESTING W REFLEX): HIV 1&2 Ab, 4th Generation: NONREACTIVE

## 2018-07-30 LAB — TSH: TSH: 1.3 m[IU]/L

## 2018-07-30 LAB — RPR: RPR Ser Ql: NONREACTIVE

## 2018-09-18 ENCOUNTER — Telehealth: Payer: Self-pay | Admitting: Family Medicine

## 2018-09-18 NOTE — Telephone Encounter (Signed)
Copied from [place] [id number]. Topic: Quick Communication - See Telephone Encounter >> Sep 18, 2018 12:57 PM Blase Mess A wrote: CRM for notification. See Telephone encounter for: 09/18/18.  Opal Sidles from [place] is requesting office notes the most important notes or an EKG. [phone], Leaving at 3pm, [phone]

## 2018-09-18 NOTE — Telephone Encounter (Signed)
[facility] should be able to access our records through care everywhere. Sorry I'm just seeing this now, however will send over our notes to Dr. Luther Hearing office through [place].

## 2018-10-30 ENCOUNTER — Encounter: Payer: Self-pay | Admitting: Family Medicine

## 2018-10-30 ENCOUNTER — Ambulatory Visit (INDEPENDENT_AMBULATORY_CARE_PROVIDER_SITE_OTHER): Payer: 59 | Admitting: Family Medicine

## 2018-10-30 VITALS — BP 124/76 | HR 68 | Temp 98.1°F | Resp 14 | Ht 68.0 in | Wt 197.9 lb

## 2018-10-30 DIAGNOSIS — Z8679 Personal history of other diseases of the circulatory system: Secondary | ICD-10-CM | POA: Insufficient documentation

## 2018-10-30 DIAGNOSIS — Z1212 Encounter for screening for malignant neoplasm of rectum: Secondary | ICD-10-CM

## 2018-10-30 DIAGNOSIS — E785 Hyperlipidemia, unspecified: Secondary | ICD-10-CM

## 2018-10-30 DIAGNOSIS — I10 Essential (primary) hypertension: Secondary | ICD-10-CM | POA: Diagnosis not present

## 2018-10-30 DIAGNOSIS — R87810 Cervical high risk human papillomavirus (HPV) DNA test positive: Secondary | ICD-10-CM

## 2018-10-30 DIAGNOSIS — Z1211 Encounter for screening for malignant neoplasm of colon: Secondary | ICD-10-CM

## 2018-10-30 DIAGNOSIS — E6609 Other obesity due to excess calories: Secondary | ICD-10-CM

## 2018-10-30 DIAGNOSIS — Z6831 Body mass index (BMI) 31.0-31.9, adult: Secondary | ICD-10-CM

## 2018-10-30 DIAGNOSIS — R011 Cardiac murmur, unspecified: Secondary | ICD-10-CM

## 2018-10-30 DIAGNOSIS — H9193 Unspecified hearing loss, bilateral: Secondary | ICD-10-CM

## 2018-10-30 DIAGNOSIS — Z8673 Personal history of transient ischemic attack (TIA), and cerebral infarction without residual deficits: Secondary | ICD-10-CM

## 2018-10-30 DIAGNOSIS — R413 Other amnesia: Secondary | ICD-10-CM

## 2018-10-30 MED ORDER — HYDROCHLOROTHIAZIDE 25 MG PO TABS
25.0000 mg | ORAL_TABLET | Freq: Every day | ORAL | 1 refills | Status: DC
Start: 2018-10-30 — End: 2019-05-23

## 2018-10-30 NOTE — Assessment & Plan Note (Signed)
Discussed importance of 150 minutes of physical activity weekly, eat two servings of fish weekly, eat one serving of tree nuts ( cashews, pistachios, pecans, almonds..) every other day, eat 6 servings of fruit/vegetables daily and drink plenty of water and avoid sweet beverages. 

## 2018-10-30 NOTE — Assessment & Plan Note (Signed)
Stable, continue current regimen, DASH diet discussed

## 2018-10-30 NOTE — Assessment & Plan Note (Addendum)
ASCVD risk score calculated. Pt does not want to use statin therapy.

## 2018-10-30 NOTE — Assessment & Plan Note (Signed)
Declines neuro appt at this time.

## 2018-10-30 NOTE — Progress Notes (Signed)
Name: Sandra Lin   MRN: [id number]    DOB: Aug 28, 1966   Date:10/30/2018       Progress Note  Subjective  Chief Complaint  Chief Complaint  Patient presents with  . Follow-up    HPV pap smear    HPI  HPV positive, negative Pap 12/17/2017 and 12/11/2016.  Pap was negative both times. No history of genital warts. No family or personal history of cervical cancers. She is sexually active with one partner - got married in November 2019.  They were both tested for HIV about 2 years ago.  STI screening was negative at last visit. We will repeat pap in 1 month with CPE, if still positive, we will refer to GYN.  Hearing loss - She has been noticing decreased hearing for 1-2 years, L>R. Denies pain in ears, or dizziness.  Does endorse intermittent ringing - L>R. Denies difficulty conversing in crowds with surrounding noise. Her children have complained that she keeps the TV very loud, she notes that sometimes voices sound "muted" or "quiet".  She declines ENT referral today. Unchanged/  HTN: BP is well controlled on HCTZ 25mg . She is normally 120's/80's. Endorses occasional chest pain on the LEFT side of her chest with some palpitations. She is schedule on February 14th with the cardiologist.  Denies headaches, shortness of breath, no vision changes. Sometimes she skips doses of her medication and has some BLE swelling - goes away with medication and elevation.  Heart Murmur: Was being seen by Philis Pique and was sent to [facility] for evaluation - had EKG but could not afford the echo at that time.  She denies chest pain, shortness of breath. Does have history HLD - we will check today.  She took statin in the past and had some yellowing of the sclera - took herself off of this medication. Stable and unchanged. The 10-year ASCVD risk score Mikey Bussing DC Brooke Bonito., et al., 2013) is: 3%   Values used to calculate the score:     Age: 53 years     Sex: Female     Is Non-Hispanic African American: Yes  Diabetic: No     Tobacco smoker: No     Systolic Blood Pressure: 833 mmHg     Is BP treated: Yes     HDL Cholesterol: 62 mg/dL     Total Cholesterol: 239 mg/dL  History CVA (Aneurysm)/Memory Issues: Occurred in September 2013.  She has been stable since then. Does not see neurology any more. She does note some short-term memory loss on occasion that has worsened over the last 1-2 years, but does not have any other residual effects. Mother's parents both had dementia in their 75's/80's.  6-CIT has score of 9 at last visit.  Referred to neurology at last visit, but she was fearful of going.  Wants to hold off for now. Stable and unchanged.   Obesity: She is not exercising right now; eating mostly fast food, home cooked meal 1-2 times a week. Discussed nutrition and exercise at length. Body mass index is 30.09 kg/m. Unchanged. She has seen nutritionist in the past at [facility], declines referral today.   Patient Active Problem List   Diagnosis Date Noted  . Memory deficit 10/30/2018  . History of aneurysm 10/30/2018  . Cervical high risk human papillomavirus (HPV) DNA test positive 07/29/2018  . Bilateral hearing loss 07/29/2018  . Essential hypertension 07/29/2018  . Morbid obesity ([place]) 07/29/2018  . Heart murmur 07/29/2018  . Allergic  to shellfish 01/16/2016  . Dyslipidemia 01/16/2016  . Cerebrovascular accident, old 01/16/2016  . H/O iron deficiency anemia 01/16/2016  . Irregular bleeding 01/16/2016  . Lipoma of lower extremity 01/16/2016  . Class 1 obesity due to excess calories with body mass index (BMI) of 31.0 to 31.9 in adult 01/16/2016  . Fibroid 01/16/2016  . Stress incontinence 01/16/2016  . Allergic rhinitis 12/19/2008    Past Surgical History:  Procedure Laterality Date  . TONSILLECTOMY    . TUBAL LIGATION    . UTERINE FIBROID EMBOLIZATION      Family History  Problem Relation Age of Onset  . Breast cancer Paternal Aunt   . Hypertension Mother     Social  History   Socioeconomic History  . Marital status: Single    Spouse name: Not on file  . Number of children: 5  . Years of education: Not on file  . Highest education level: Not on file  Occupational History  . Not on file  Social Needs  . Financial resource strain: Not hard at all  . Food insecurity:    Worry: Never true    Inability: Never true  . Transportation needs:    Medical: No    Non-medical: No  Tobacco Use  . Smoking status: Former Research scientist (life sciences)  . Smokeless tobacco: Never Used  Substance and Sexual Activity  . Alcohol use: Yes    Comment: occasional  . Drug use: No  . Sexual activity: Not Currently  Lifestyle  . Physical activity:    Days per week: 0 days    Minutes per session: 0 min  . Stress: Not at all  Relationships  . Social connections:    Talks on phone: More than three times a week    Gets together: More than three times a week    Attends religious service: More than 4 times per year    Active member of club or organization: Yes    Attends meetings of clubs or organizations: Never    Relationship status: Never married  . Intimate partner violence:    Fear of current or ex partner: No    Emotionally abused: No    Physically abused: No    Forced sexual activity: No  Other Topics Concern  . Not on file  Social History Narrative  . Not on file     Current Outpatient Medications:  .  Apoaequorin (PREVAGEN PO), Take by mouth., Disp: , Rfl:  .  hydrochlorothiazide (HYDRODIURIL) 25 MG tablet, Take 1 tablet (25 mg total) by mouth daily., Disp: 90 tablet, Rfl: 1  Allergies  Allergen Reactions  . Dust Mite Extract   . Molds & Smuts   . Shellfish Allergy Swelling    And rash    I personally reviewed active problem list, medication list, allergies, health maintenance, notes from last encounter, lab results with the patient/caregiver today.   ROS  Ten systems reviewed and is negative except as mentioned in HPI  Objective  Vitals:   10/30/18  0945  BP: 124/76  Pulse: 68  Resp: 14  Temp: 98.1 F (36.7 C)  TempSrc: Oral  SpO2: 97%  Weight: 197 lb 14.4 oz (89.8 kg)  Height: 5\' 8"  (1.727 m)   Body mass index is 30.09 kg/m.  Physical Exam Constitutional: Patient appears well-developed and well-nourished. No distress.  HENT: Head: Normocephalic and atraumatic. Ears: bilateral TMs with no erythema or effusion Eyes: Conjunctivae and EOM are normal. No scleral icterus.  Neck: Normal range of  motion. Neck supple. No JVD present. No thyromegaly present.  Cardiovascular: Normal rate, regular rhythm and 1-2/6 murmur present. Very trace BLE edema. Pulmonary/Chest: Effort normal and breath sounds normal. No respiratory distress. Musculoskeletal: Normal range of motion, no joint effusions. No gross deformities Neurological: Pt is alert and oriented to person, place, and time. No cranial nerve deficit. Coordination, balance, strength, speech and gait are normal.  Skin: Skin is warm and dry. No rash noted. No erythema.  Psychiatric: Patient has a normal mood and affect. behavior is normal. Judgment and thought content normal.  No results found for this or any previous visit (from the past 72 hour(s)).  PHQ2/9: Depression screen [facility] 2/9 10/30/2018 07/29/2018  Decreased Interest 0 0  Down, Depressed, Hopeless 0 0  PHQ - 2 Score 0 0  Altered sleeping 0 0  Tired, decreased energy 0 0  Change in appetite 0 0  Feeling bad or failure about yourself  0 0  Trouble concentrating 0 0  Moving slowly or fidgety/restless 0 0  Suicidal thoughts 0 0  PHQ-9 Score 0 0  Difficult doing work/chores Not difficult at all Not difficult at all   Fall Risk: Fall Risk  10/30/2018 07/29/2018  Falls in the past year? 0 0  Number falls in past yr: 0 -  Injury with Fall? 0 -  Follow up Falls evaluation completed -   Assessment & Plan  Problem List Items Addressed This Visit      Cardiovascular and Mediastinum   Essential hypertension - Primary     Stable, continue current regimen, DASH diet discussed      Relevant Medications   hydrochlorothiazide (HYDRODIURIL) 25 MG tablet     Nervous and Auditory   Bilateral hearing loss    Declines ENT at this time        Other   Dyslipidemia    ASCVD risk score calculated. Pt does not want to use statin therapy.      Cerebrovascular accident, old    Secondary to aneurysm, stable at this time. Declines neurology appointment.      Class 1 obesity due to excess calories with body mass index (BMI) of 31.0 to 31.9 in adult    Discussed importance of 150 minutes of physical activity weekly, eat two servings of fish weekly, eat one serving of tree nuts ( cashews, pistachios, pecans, almonds.Marland Kitchen) every other day, eat 6 servings of fruit/vegetables daily and drink plenty of water and avoid sweet beverages.        Cervical high risk human papillomavirus (HPV) DNA test positive    Repeat with CPE in March 2020      Morbid obesity [facility])    Discussed importance of 150 minutes of physical activity weekly, eat two servings of fish weekly, eat one serving of tree nuts ( cashews, pistachios, pecans, almonds.Marland Kitchen) every other day, eat 6 servings of fruit/vegetables daily and drink plenty of water and avoid sweet beverages.        Heart murmur    Very mild on examination today; seeing cardiology next week      Memory deficit    Declines neuro appt at this time.      History of aneurysm    Other Visit Diagnoses    Screening for colorectal cancer       Relevant Orders   Ambulatory referral to Gastroenterology

## 2018-10-30 NOTE — Assessment & Plan Note (Signed)
Secondary to aneurysm, stable at this time. Declines neurology appointment.

## 2018-10-30 NOTE — Assessment & Plan Note (Signed)
Declines ENT at this time

## 2018-10-30 NOTE — Patient Instructions (Addendum)
Fat and Cholesterol Restricted Eating Plan Getting too much fat and cholesterol in your diet may cause health problems. Choosing the right foods helps keep your fat and cholesterol at normal levels. This can keep you from getting certain diseases. Your doctor may recommend an eating plan that includes:  Total fat: ______% or less of total calories a day.  Saturated fat: ______% or less of total calories a day.  Cholesterol: less than _________mg a day.  Fiber: ______g a day. What are tips for following this plan? Meal planning  At meals, divide your plate into four equal parts: ? Fill one-half of your plate with vegetables and green salads. ? Fill one-fourth of your plate with whole grains. ? Fill one-fourth of your plate with low-fat (lean) protein foods.  Eat fish that is high in omega-3 fats at least two times a week. This includes mackerel, tuna, sardines, and salmon.  Eat foods that are high in fiber, such as whole grains, beans, apples, broccoli, carrots, peas, and barley. General tips   Work with your doctor to lose weight if you need to.  Avoid: ? Foods with added sugar. ? Fried foods. ? Foods with partially hydrogenated oils.  Limit alcohol intake to no more than 1 drink a day for nonpregnant women and 2 drinks a day for men. One drink equals 12 oz of beer, 5 oz of wine, or 1 oz of hard liquor. Reading food labels  Check food labels for: ? Trans fats. ? Partially hydrogenated oils. ? Saturated fat (g) in each serving. ? Cholesterol (mg) in each serving. ? Fiber (g) in each serving.  Choose foods with healthy fats, such as: ? Monounsaturated fats. ? Polyunsaturated fats. ? Omega-3 fats.  Choose grain products that have whole grains. Look for the word "whole" as the first word in the ingredient list. Cooking  Cook foods using low-fat methods. These include baking, boiling, grilling, and broiling.  Eat more home-cooked foods. Eat at restaurants and buffets  less often.  Avoid cooking using saturated fats, such as butter, cream, palm oil, palm kernel oil, and coconut oil. Recommended foods  Fruits  All fresh, canned (in natural juice), or frozen fruits. Vegetables  Fresh or frozen vegetables (raw, steamed, roasted, or grilled). Green salads. Grains  Whole grains, such as whole wheat or whole grain breads, crackers, cereals, and pasta. Unsweetened oatmeal, bulgur, barley, quinoa, or brown rice. Corn or whole wheat flour tortillas. Meats and other protein foods  Ground beef (85% or leaner), grass-fed beef, or beef trimmed of fat. Skinless chicken or turkey. Ground chicken or turkey. Pork trimmed of fat. All fish and seafood. Egg whites. Dried beans, peas, or lentils. Unsalted nuts or seeds. Unsalted canned beans. Nut butters without added sugar or oil. Dairy  Low-fat or nonfat dairy products, such as skim or 1% milk, 2% or reduced-fat cheeses, low-fat and fat-free ricotta or cottage cheese, or plain low-fat and nonfat yogurt. Fats and oils  Tub margarine without trans fats. Light or reduced-fat mayonnaise and salad dressings. Avocado. Olive, canola, sesame, or safflower oils. The items listed above may not be a complete list of foods and beverages you can eat. Contact a dietitian for more information. Foods to avoid Fruits  Canned fruit in heavy syrup. Fruit in cream or butter sauce. Fried fruit. Vegetables  Vegetables cooked in cheese, cream, or butter sauce. Fried vegetables. Grains  White bread. White pasta. White rice. Cornbread. Bagels, pastries, and croissants. Crackers and snack foods that contain trans fat   and hydrogenated oils. Meats and other protein foods  Fatty cuts of meat. Ribs, chicken wings, bacon, sausage, bologna, salami, chitterlings, fatback, hot dogs, bratwurst, and packaged lunch meats. Liver and organ meats. Whole eggs and egg yolks. Chicken and turkey with skin. Fried meat. Dairy  Whole or 2% milk, cream,  half-and-half, and cream cheese. Whole milk cheeses. Whole-fat or sweetened yogurt. Full-fat cheeses. Nondairy creamers and whipped toppings. Processed cheese, cheese spreads, and cheese curds. Beverages  Alcohol. Sugar-sweetened drinks such as sodas, lemonade, and fruit drinks. Fats and oils  Butter, stick margarine, lard, shortening, ghee, or bacon fat. Coconut, palm kernel, and palm oils. Sweets and desserts  Corn syrup, sugars, honey, and molasses. Candy. Jam and jelly. Syrup. Sweetened cereals. Cookies, pies, cakes, donuts, muffins, and ice cream. The items listed above may not be a complete list of foods and beverages you should avoid. Contact a dietitian for more information. Summary  Choosing the right foods helps keep your fat and cholesterol at normal levels. This can keep you from getting certain diseases.  At meals, fill one-half of your plate with vegetables and green salads.  Eat high-fiber foods, like whole grains, beans, apples, carrots, peas, and barley.  Limit added sugar, saturated fats, alcohol, and fried foods. This information is not intended to replace advice given to you by your health care provider. Make sure you discuss any questions you have with your health care provider. Document Released: 03/10/2012 Document Revised: 05/13/2018 Document Reviewed: 05/27/2017 Elsevier Interactive Patient Education  2019 [employer]   DASH Eating Plan DASH stands for "Dietary Approaches to Stop Hypertension." The DASH eating plan is a healthy eating plan that has been shown to reduce high blood pressure (hypertension). It may also reduce your risk for type 2 diabetes, heart disease, and stroke. The DASH eating plan may also help with weight loss. What are tips for following this plan?  General guidelines  Avoid eating more than 2,300 mg (milligrams) of salt (sodium) a day. If you have hypertension, you may need to reduce your sodium intake to 1,500 mg a day.  Limit  alcohol intake to no more than 1 drink a day for nonpregnant women and 2 drinks a day for men. One drink equals 12 oz of beer, 5 oz of wine, or 1 oz of hard liquor.  Work with your health care provider to maintain a healthy body weight or to lose weight. Ask what an ideal weight is for you.  Get at least 30 minutes of exercise that causes your heart to beat faster (aerobic exercise) most days of the week. Activities may include walking, swimming, or biking.  Work with your health care provider or diet and nutrition specialist (dietitian) to adjust your eating plan to your individual calorie needs. Reading food labels   Check food labels for the amount of sodium per serving. Choose foods with less than 5 percent of the Daily Value of sodium. Generally, foods with less than 300 mg of sodium per serving fit into this eating plan.  To find whole grains, look for the word "whole" as the first word in the ingredient list. Shopping  Buy products labeled as "low-sodium" or "no salt added."  Buy fresh foods. Avoid canned foods and premade or frozen meals. Cooking  Avoid adding salt when cooking. Use salt-free seasonings or herbs instead of table salt or sea salt. Check with your health care provider or pharmacist before using salt substitutes.  Do not fry foods. Cook foods using healthy   methods such as baking, boiling, grilling, and broiling instead.  Cook with heart-healthy oils, such as olive, canola, soybean, or sunflower oil. Meal planning  Eat a balanced diet that includes: ? 5 or more servings of fruits and vegetables each day. At each meal, try to fill half of your plate with fruits and vegetables. ? Up to 6-8 servings of whole grains each day. ? Less than 6 oz of lean meat, poultry, or fish each day. A 3-oz serving of meat is about the same size as a deck of cards. One egg equals 1 oz. ? 2 servings of low-fat dairy each day. ? A serving of nuts, seeds, or beans 5 times each  week. ? Heart-healthy fats. Healthy fats called Omega-3 fatty acids are found in foods such as flaxseeds and coldwater fish, like sardines, salmon, and mackerel.  Limit how much you eat of the following: ? Canned or prepackaged foods. ? Food that is high in trans fat, such as fried foods. ? Food that is high in saturated fat, such as fatty meat. ? Sweets, desserts, sugary drinks, and other foods with added sugar. ? Full-fat dairy products.  Do not salt foods before eating.  Try to eat at least 2 vegetarian meals each week.  Eat more home-cooked food and less restaurant, buffet, and fast food.  When eating at a restaurant, ask that your food be prepared with less salt or no salt, if possible. What foods are recommended? The items listed may not be a complete list. Talk with your dietitian about what dietary choices are best for you. Grains Whole-grain or whole-wheat bread. Whole-grain or whole-wheat pasta. Brown rice. Oatmeal. Quinoa. Bulgur. Whole-grain and low-sodium cereals. Pita bread. Low-fat, low-sodium crackers. Whole-wheat flour tortillas. Vegetables Fresh or frozen vegetables (raw, steamed, roasted, or grilled). Low-sodium or reduced-sodium tomato and vegetable juice. Low-sodium or reduced-sodium tomato sauce and tomato paste. Low-sodium or reduced-sodium canned vegetables. Fruits All fresh, dried, or frozen fruit. Canned fruit in natural juice (without added sugar). Meat and other protein foods Skinless chicken or turkey. Ground chicken or turkey. Pork with fat trimmed off. Fish and seafood. Egg whites. Dried beans, peas, or lentils. Unsalted nuts, nut butters, and seeds. Unsalted canned beans. Lean cuts of beef with fat trimmed off. Low-sodium, lean deli meat. Dairy Low-fat (1%) or fat-free (skim) milk. Fat-free, low-fat, or reduced-fat cheeses. Nonfat, low-sodium ricotta or cottage cheese. Low-fat or nonfat yogurt. Low-fat, low-sodium cheese. Fats and oils Soft margarine  without trans fats. Vegetable oil. Low-fat, reduced-fat, or light mayonnaise and salad dressings (reduced-sodium). Canola, safflower, olive, soybean, and sunflower oils. Avocado. Seasoning and other foods Herbs. Spices. Seasoning mixes without salt. Unsalted popcorn and pretzels. Fat-free sweets. What foods are not recommended? The items listed may not be a complete list. Talk with your dietitian about what dietary choices are best for you. Grains Baked goods made with fat, such as croissants, muffins, or some breads. Dry pasta or rice meal packs. Vegetables Creamed or fried vegetables. Vegetables in a cheese sauce. Regular canned vegetables (not low-sodium or reduced-sodium). Regular canned tomato sauce and paste (not low-sodium or reduced-sodium). Regular tomato and vegetable juice (not low-sodium or reduced-sodium). Pickles. Olives. Fruits Canned fruit in a light or heavy syrup. Fried fruit. Fruit in cream or butter sauce. Meat and other protein foods Fatty cuts of meat. Ribs. Fried meat. Bacon. Sausage. Bologna and other processed lunch meats. Salami. Fatback. Hotdogs. Bratwurst. Salted nuts and seeds. Canned beans with added salt. Canned or smoked fish. Whole eggs   or egg yolks. Chicken or turkey with skin. Dairy Whole or 2% milk, cream, and half-and-half. Whole or full-fat cream cheese. Whole-fat or sweetened yogurt. Full-fat cheese. Nondairy creamers. Whipped toppings. Processed cheese and cheese spreads. Fats and oils Butter. Stick margarine. Lard. Shortening. Ghee. Bacon fat. Tropical oils, such as coconut, palm kernel, or palm oil. Seasoning and other foods Salted popcorn and pretzels. Onion salt, garlic salt, seasoned salt, table salt, and sea salt. Worcestershire sauce. Tartar sauce. Barbecue sauce. Teriyaki sauce. Soy sauce, including reduced-sodium. Steak sauce. Canned and packaged gravies. Fish sauce. Oyster sauce. Cocktail sauce. Horseradish that you find on the shelf. Ketchup.  Mustard. Meat flavorings and tenderizers. Bouillon cubes. Hot sauce and Tabasco sauce. Premade or packaged marinades. Premade or packaged taco seasonings. Relishes. Regular salad dressings. Where to find more information:  National Heart, Lung, and Blood Institute: www.nhlbi.nih.gov  American Heart Association: www.heart.org Summary  The DASH eating plan is a healthy eating plan that has been shown to reduce high blood pressure (hypertension). It may also reduce your risk for type 2 diabetes, heart disease, and stroke.  With the DASH eating plan, you should limit salt (sodium) intake to 2,300 mg a day. If you have hypertension, you may need to reduce your sodium intake to 1,500 mg a day.  When on the DASH eating plan, aim to eat more fresh fruits and vegetables, whole grains, lean proteins, low-fat dairy, and heart-healthy fats.  Work with your health care provider or diet and nutrition specialist (dietitian) to adjust your eating plan to your individual calorie needs. This information is not intended to replace advice given to you by your health care provider. Make sure you discuss any questions you have with your health care provider. Document Released: 08/29/2011 Document Revised: 09/02/2016 Document Reviewed: 09/02/2016 Elsevier Interactive Patient Education  2019 Elsevier Inc.   

## 2018-10-30 NOTE — Assessment & Plan Note (Signed)
Repeat with CPE in March 2020

## 2018-10-30 NOTE — Assessment & Plan Note (Signed)
Very mild on examination today; seeing cardiology next week

## 2018-11-04 ENCOUNTER — Other Ambulatory Visit: Payer: Self-pay

## 2018-11-04 DIAGNOSIS — Z1211 Encounter for screening for malignant neoplasm of colon: Secondary | ICD-10-CM

## 2018-11-06 ENCOUNTER — Ambulatory Visit (INDEPENDENT_AMBULATORY_CARE_PROVIDER_SITE_OTHER): Payer: 59 | Admitting: Internal Medicine

## 2018-11-06 ENCOUNTER — Encounter: Payer: Self-pay | Admitting: Internal Medicine

## 2018-11-06 VITALS — BP 156/92 | HR 69 | Ht 65.0 in | Wt 197.0 lb

## 2018-11-06 DIAGNOSIS — I10 Essential (primary) hypertension: Secondary | ICD-10-CM

## 2018-11-06 DIAGNOSIS — R011 Cardiac murmur, unspecified: Secondary | ICD-10-CM

## 2018-11-06 DIAGNOSIS — R0789 Other chest pain: Secondary | ICD-10-CM | POA: Diagnosis not present

## 2018-11-06 NOTE — Progress Notes (Signed)
New Outpatient Visit Date: 11/06/2018  Referring Provider: Hubbard Hartshorn, [address], [place] [address]  Chief Complaint: Heart murmur  HPI:  Ms. Sandra Lin is a 53 y.o. female who is being seen today for the evaluation of heart murmur at the request of Ms. Boyce. She has a history of hypertension and subarachnoid hemorrhage (2013).  She was seen by [facility] cardiology in 04/2017 on account of a systolic heart murmur.  Echocardiogram was ordered but never performed due to cost/insurance limitations.  Today, Ms, Sandra Lin reports feeling well.  However, she notes exertional dyspnea when carrying heavy items at work.  She also notes occasional left-sided chest pain just below the clavicle.  It is not exertional and occurs randomly.  The pain is sharp and lasts ~1 minute.  There are no associated symptoms.  It happens about once a month.  She also notes rare palpitations that last ~1 minute and are associated with mild shortness of breath.  Episodes occur once every few months.  She denies orthopnea, PND, and lightheadedness.  She notes occasional dependent edema.  In regard to her heart murmur, Ms. Sandra Lin reports that she was first told about the heart murmur over a year ago.  She has never undergone any cardiac testing.  She reports that her BP at home and at work is typically 120's-130's/60's-70's.  She did not take her HCTZ today.  --------------------------------------------------------------------------------------------------  Cardiovascular History & Procedures: Cardiovascular Problems:  Heart murmur  Subarachnoid hemorrhage  Risk Factors:  Hypertension  Cath/PCI:  None  CV Surgery:  None  EP Procedures and Devices:  None  Non-Invasive Evaluation(s):  None  Recent CV Pertinent Labs: Lab Results  Component Value Date   CHOL 239 (H) 07/29/2018   HDL 62 07/29/2018   LDLCALC 154 (H) 07/29/2018   TRIG 109 07/29/2018   CHOLHDL 3.9 07/29/2018   INR 0.9  05/28/2012   K 3.8 07/29/2018   K 3.4 (L) 05/23/2013   BUN 15 07/29/2018   BUN 6 (L) 05/23/2013   CREATININE 0.79 07/29/2018    --------------------------------------------------------------------------------------------------  Past Medical History:  Diagnosis Date  . Hypertension   . Migraine   . Subarachnoid hematoma ([place]) 2013    Past Surgical History:  Procedure Laterality Date  . TONSILLECTOMY    . TUBAL LIGATION    . UTERINE FIBROID EMBOLIZATION      Current Meds  Medication Sig  . Apoaequorin (PREVAGEN PO) Take by mouth.  . hydrochlorothiazide (HYDRODIURIL) 25 MG tablet Take 1 tablet (25 mg total) by mouth daily.    Allergies: Dust mite extract; Molds & smuts; and Shellfish allergy  Social History   Tobacco Use  . Smoking status: Former Smoker    Packs/day: 1.00    Years: 14.00    Pack years: 14.00    Types: Cigarettes    Last attempt to quit: 1994    Years since quitting: 26.1  . Smokeless tobacco: Never Used  Substance Use Topics  . Alcohol use: Yes    Alcohol/week: 3.0 standard drinks    Types: 3 Glasses of wine per week  . Drug use: No    Family History  Problem Relation Age of Onset  . Breast cancer Paternal Aunt   . Hypertension Mother     Review of Systems: A 12-system review of systems was performed and was negative except as noted in the HPI.  --------------------------------------------------------------------------------------------------  Physical Exam: BP (!) 156/92 (BP Location: Right Arm, Patient Position: Sitting, Cuff Size:  Normal)   Pulse 69   Ht 5\' 5"  (1.651 m)   Wt 197 lb (89.4 kg)   BMI 32.78 kg/m   General:  NAD.  Accompanied by her mother. HEENT: No conjunctival pallor or scleral icterus. Moist mucous membranes. OP clear. Neck: Supple without lymphadenopathy, thyromegaly, JVD, or HJR. No carotid bruit. Lungs: Normal work of breathing. Clear to auscultation bilaterally without wheezes or crackles. Heart: Regular  rate and rhythm with 2/6 systolic murmur loudest at the RUSB.  No rubs or gallops.  Unable to assess PMI due to body habitus. Abd: Bowel sounds present. Soft, NT/ND.  Unable to assess HSM due to body habitus. Ext: No lower extremity edema. Radial, PT, and DP pulses are 2+ bilaterally Skin: Warm and dry without rash. Neuro: CNIII-XII intact. Strength and fine-touch sensation intact in upper and lower extremities bilaterally. Psych: Normal mood and affect.  EKG:  NSR with borderline LAE.  No significant abnormality.  Lab Results  Component Value Date   WBC 6.9 07/29/2018   HGB 13.4 07/29/2018   HCT 38.2 07/29/2018   MCV 86.0 07/29/2018   PLT 255 07/29/2018    Lab Results  Component Value Date   NA 140 07/29/2018   K 3.8 07/29/2018   CL 103 07/29/2018   CO2 29 07/29/2018   BUN 15 07/29/2018   CREATININE 0.79 07/29/2018   GLUCOSE 92 07/29/2018   ALT 20 07/29/2018    Lab Results  Component Value Date   CHOL 239 (H) 07/29/2018   HDL 62 07/29/2018   LDLCALC 154 (H) 07/29/2018   TRIG 109 07/29/2018   CHOLHDL 3.9 07/29/2018     --------------------------------------------------------------------------------------------------  ASSESSMENT AND PLAN: Heart murmur Noted on exam today.  Given exertional dyspnea, we have agreed to obtain a transthoracic echocardiogram for further evaluation.  Atypical chest pain Timing and quality are not consistent with angina.  EKG is normal.  We will obtain, echo, as below.  No ischemia evaluation at this time.  Hypertension BP mildly elevated today.  I have encouraged Ms. Sandra Lin to continue taking HTCZ regularly and to limit her sodium intake.  I will defer ongoing management to Ms. Boyce.  Follow-up:  Return to clinic in 3 months.  Nelva Bush, MD 11/07/2018 11:39 AM

## 2018-11-06 NOTE — Patient Instructions (Signed)
Medication Instructions:  Your physician recommends that you continue on your current medications as directed. Please refer to the Current Medication list given to you today.  If you need a refill on your cardiac medications before your next appointment, please call your pharmacy.   Lab work: none If you have labs (blood work) drawn today and your tests are completely normal, you will receive your results only by: Marland Kitchen MyChart Message (if you have MyChart) OR . A paper copy in the mail If you have any lab test that is abnormal or we need to change your treatment, we will call you to review the results.  Testing/Procedures: Your physician has requested that you have an echocardiogram. Echocardiography is a painless test that uses sound waves to create images of your heart. It provides your doctor with information about the size and shape of your heart and how well your heart's chambers and valves are working. This procedure takes approximately one hour. There are no restrictions for this procedure. You may get an IV, if needed, to receive an ultrasound enhancing agent through to better visualize your heart.     Follow-Up: At [facility], you and your health needs are our priority.  As part of our continuing mission to provide you with exceptional heart care, we have created designated Provider Care Teams.  These Care Teams include your primary Cardiologist (physician) and Advanced Practice Providers (APPs -  Physician Assistants and Nurse Practitioners) who all work together to provide you with the care you need, when you need it. You will need a follow up appointment in 3 months.  Please call our office 2 months in advance to schedule this appointment.  You may see DR Harrell Gave END or one of the following Advanced Practice Providers on your designated Care Team:   Murray Hodgkins, NP Christell Faith, PA-C . Marrianne Mood, PA-C     Echocardiogram An echocardiogram is a procedure that  uses painless sound waves (ultrasound) to produce an image of the heart. Images from an echocardiogram can provide important information about:  Signs of coronary artery disease (CAD).  Aneurysm detection. An aneurysm is a weak or damaged part of an artery wall that bulges out from the normal force of blood pumping through the body.  Heart size and shape. Changes in the size or shape of the heart can be associated with certain conditions, including heart failure, aneurysm, and CAD.  Heart muscle function.  Heart valve function.  Signs of a past heart attack.  Fluid buildup around the heart.  Thickening of the heart muscle.  A tumor or infectious growth around the heart valves. Tell a health care provider about:  Any allergies you have.  All medicines you are taking, including vitamins, herbs, eye drops, creams, and over-the-counter medicines.  Any blood disorders you have.  Any surgeries you have had.  Any medical conditions you have.  Whether you are pregnant or may be pregnant. What are the risks? Generally, this is a safe procedure. However, problems may occur, including:  Allergic reaction to dye (contrast) that may be used during the procedure. What happens before the procedure? No specific preparation is needed. You may eat and drink normally. What happens during the procedure?   An IV tube may be inserted into one of your veins.  You may receive contrast through this tube. A contrast is an injection that improves the quality of the pictures from your heart.  A gel will be applied to your chest.  A  wand-like tool (transducer) will be moved over your chest. The gel will help to transmit the sound waves from the transducer.  The sound waves will harmlessly bounce off of your heart to allow the heart images to be captured in real-time motion. The images will be recorded on a computer. The procedure may vary among health care providers and hospitals. What happens  after the procedure?  You may return to your normal, everyday life, including diet, activities, and medicines, unless your health care provider tells you not to do that. Summary  An echocardiogram is a procedure that uses painless sound waves (ultrasound) to produce an image of the heart.  Images from an echocardiogram can provide important information about the size and shape of your heart, heart muscle function, heart valve function, and fluid buildup around your heart.  You do not need to do anything to prepare before this procedure. You may eat and drink normally.  After the echocardiogram is completed, you may return to your normal, everyday life, unless your health care provider tells you not to do that. This information is not intended to replace advice given to you by your health care provider. Make sure you discuss any questions you have with your health care provider. Document Released: 09/06/2000 Document Revised: 10/12/2016 Document Reviewed: 10/12/2016 Elsevier Interactive Patient Education  2019 [employer].

## 2018-11-07 ENCOUNTER — Encounter: Payer: Self-pay | Admitting: Internal Medicine

## 2018-11-07 DIAGNOSIS — R0789 Other chest pain: Secondary | ICD-10-CM | POA: Insufficient documentation

## 2018-11-20 NOTE — Progress Notes (Signed)
Patient never responded or returned for abnormal pap follow-up.  She now has insurance. Case Closed.

## 2018-11-26 ENCOUNTER — Ambulatory Visit
Admission: RE | Admit: 2018-11-26 | Discharge: 2018-11-26 | Disposition: A | Discharge status: Home / Self Care | Payer: 59 | Attending: Gastroenterology | Admitting: Gastroenterology

## 2018-11-26 ENCOUNTER — Ambulatory Visit: Payer: 59 | Admitting: Certified Registered Nurse Anesthetist

## 2018-11-26 ENCOUNTER — Encounter
Admission: RE | Disposition: A | Discharge status: Home / Self Care | Payer: Self-pay | Source: Home / Self Care | Attending: Gastroenterology

## 2018-11-26 ENCOUNTER — Other Ambulatory Visit: Payer: Self-pay

## 2018-11-26 ENCOUNTER — Encounter: Payer: Self-pay | Admitting: Certified Registered Nurse Anesthetist

## 2018-11-26 DIAGNOSIS — D122 Benign neoplasm of ascending colon: Secondary | ICD-10-CM | POA: Diagnosis not present

## 2018-11-26 DIAGNOSIS — I10 Essential (primary) hypertension: Secondary | ICD-10-CM | POA: Insufficient documentation

## 2018-11-26 DIAGNOSIS — K635 Polyp of colon: Secondary | ICD-10-CM | POA: Diagnosis not present

## 2018-11-26 DIAGNOSIS — Z91013 Allergy to seafood: Secondary | ICD-10-CM | POA: Diagnosis not present

## 2018-11-26 DIAGNOSIS — D125 Benign neoplasm of sigmoid colon: Secondary | ICD-10-CM

## 2018-11-26 DIAGNOSIS — Z1211 Encounter for screening for malignant neoplasm of colon: Secondary | ICD-10-CM | POA: Diagnosis not present

## 2018-11-26 DIAGNOSIS — K5730 Diverticulosis of large intestine without perforation or abscess without bleeding: Secondary | ICD-10-CM | POA: Insufficient documentation

## 2018-11-26 DIAGNOSIS — Z91048 Other nonmedicinal substance allergy status: Secondary | ICD-10-CM | POA: Diagnosis not present

## 2018-11-26 DIAGNOSIS — K5790 Diverticulosis of intestine, part unspecified, without perforation or abscess without bleeding: Secondary | ICD-10-CM | POA: Diagnosis not present

## 2018-11-26 DIAGNOSIS — Z87891 Personal history of nicotine dependence: Secondary | ICD-10-CM | POA: Insufficient documentation

## 2018-11-26 DIAGNOSIS — D126 Benign neoplasm of colon, unspecified: Secondary | ICD-10-CM | POA: Diagnosis not present

## 2018-11-26 HISTORY — PX: COLONOSCOPY WITH PROPOFOL: SHX5780

## 2018-11-26 LAB — POCT PREGNANCY, URINE: Preg Test, Ur: NEGATIVE

## 2018-11-26 SURGERY — COLONOSCOPY WITH PROPOFOL
Anesthesia: General

## 2018-11-26 MED ORDER — SODIUM CHLORIDE 0.9 % IV SOLN
INTRAVENOUS | Status: DC
  Administered 2018-11-26: 1000 mL via INTRAVENOUS

## 2018-11-26 MED ORDER — PROPOFOL 500 MG/50ML IV EMUL
INTRAVENOUS | Status: DC | PRN
  Administered 2018-11-26: 175 ug/kg/min via INTRAVENOUS

## 2018-11-26 MED ORDER — LIDOCAINE HCL (PF) 1 % IJ SOLN
2.0000 mL | Freq: Once | INTRAMUSCULAR | Status: AC
  Administered 2018-11-26: 0.3 mL via INTRADERMAL

## 2018-11-26 MED ORDER — PROPOFOL 10 MG/ML IV BOLUS
INTRAVENOUS | Status: DC | PRN
  Administered 2018-11-26: 70 mg via INTRAVENOUS

## 2018-11-26 MED ORDER — LIDOCAINE HCL (CARDIAC) PF 100 MG/5ML IV SOSY
PREFILLED_SYRINGE | INTRAVENOUS | Status: DC | PRN
  Administered 2018-11-26: 50 mg via INTRAVENOUS

## 2018-11-26 MED ORDER — LIDOCAINE HCL (PF) 1 % IJ SOLN
INTRAMUSCULAR | Status: AC
  Administered 2018-11-26: 0.3 mL via INTRADERMAL
  Filled 2018-11-26: qty 2

## 2018-11-26 MED ORDER — PROPOFOL 500 MG/50ML IV EMUL
INTRAVENOUS | Status: AC
  Filled 2018-11-26: qty 50

## 2018-11-26 NOTE — Anesthesia Postprocedure Evaluation (Signed)
Anesthesia Post Note  Patient: Sandra Lin  Procedure(s) Performed: COLONOSCOPY WITH PROPOFOL (N/A )  Patient location during evaluation: Endoscopy Anesthesia Type: General Level of consciousness: awake and alert Pain management: pain level controlled Vital Signs Assessment: post-procedure vital signs reviewed and stable Respiratory status: spontaneous breathing, nonlabored ventilation, respiratory function stable and patient connected to nasal cannula oxygen Cardiovascular status: blood pressure returned to baseline and stable Postop Assessment: no apparent nausea or vomiting Anesthetic complications: no     Last Vitals:  Vitals:   11/26/18 0855 11/26/18 0905  BP: 136/81 (!) 145/85  Pulse: (!) 56 (!) 57  Resp: 16 16  Temp:    SpO2: 100% 100%    Last Pain:  Vitals:   11/26/18 0905  TempSrc:   PainSc: 0-No pain                 Martha Clan

## 2018-11-26 NOTE — H&P (Signed)
Jonathon Bellows, MD [address], [place], [place], [place], [address] [address], [place], [place], [place], [address] Phone: [phone]  Fax: [phone]  Primary Care Physician:  Hubbard Hartshorn, FNP   Pre-Procedure History & Physical: HPI:  Sandra Lin is a 53 y.o. female is here for an colonoscopy.   Past Medical History:  Diagnosis Date  . Hypertension   . Migraine   . Subarachnoid hematoma ([facility]) 2013    Past Surgical History:  Procedure Laterality Date  . TONSILLECTOMY    . TUBAL LIGATION    . UTERINE FIBROID EMBOLIZATION      Prior to Admission medications   Medication Sig Start Date End Date Taking? Authorizing Provider  Apoaequorin (PREVAGEN PO) Take by mouth.    [provider]  hydrochlorothiazide (HYDRODIURIL) 25 MG tablet Take 1 tablet (25 mg total) by mouth daily. 10/30/18   Hubbard Hartshorn, FNP    Allergies as of 11/04/2018 - Review Complete 10/30/2018  Allergen Reaction Noted  . Dust mite extract  01/16/2016  . Molds & smuts  01/16/2016  . Shellfish allergy Swelling 09/19/2015    Family History  Problem Relation Age of Onset  . Breast cancer Paternal Aunt   . Hypertension Mother     Social History   Socioeconomic History  . Marital status: Single    Spouse name: Not on file  . Number of children: 5  . Years of education: Not on file  . Highest education level: Not on file  Occupational History  . Not on file  Social Needs  . Financial resource strain: Not hard at all  . Food insecurity:    Worry: Never true    Inability: Never true  . Transportation needs:    Medical: No    Non-medical: No  Tobacco Use  . Smoking status: Former Smoker    Packs/day: 1.00    Years: 14.00    Pack years: 14.00    Types: Cigarettes    Last attempt to quit: 1994    Years since quitting: 26.1  . Smokeless tobacco: Never Used  Substance and Sexual Activity  . Alcohol use: Yes    Alcohol/week: 3.0 standard drinks    Types: 3  Glasses of wine per week  . Drug use: No  . Sexual activity: Not Currently  Lifestyle  . Physical activity:    Days per week: 0 days    Minutes per session: 0 min  . Stress: Not at all  Relationships  . Social connections:    Talks on phone: More than three times a week    Gets together: More than three times a week    Attends religious service: More than 4 times per year    Active member of club or organization: Yes    Attends meetings of clubs or organizations: Never    Relationship status: Never married  . Intimate partner violence:    Fear of current or ex partner: No    Emotionally abused: No    Physically abused: No    Forced sexual activity: No  Other Topics Concern  . Not on file  Social History Narrative  . Not on file    Review of Systems: See HPI, otherwise negative ROS  Physical Exam: BP (!) 166/91   Pulse 63   Temp 97.6 F (36.4 C) (Tympanic)   Resp 16   Ht 5\' 5"  (1.651 m)   Wt 89.8 kg   SpO2 99%  BMI 32.95 kg/m  General:   Alert,  pleasant and cooperative in NAD Head:  Normocephalic and atraumatic. Neck:  Supple; no masses or thyromegaly. Lungs:  Clear throughout to auscultation, normal respiratory effort.    Heart:  +S1, +S2, Regular rate and rhythm, No edema. Abdomen:  Soft, nontender and nondistended. Normal bowel sounds, without guarding, and without rebound.   Neurologic:  Alert and  oriented x4;  grossly normal neurologically.  Impression/Plan: Sandra Lin is here for an colonoscopy to be performed for Screening colonoscopy average risk   Risks, benefits, limitations, and alternatives regarding  colonoscopy have been reviewed with the patient.  Questions have been answered.  All parties agreeable.   Jonathon Bellows, MD  11/26/2018, 8:14 AM

## 2018-11-26 NOTE — Op Note (Signed)
[facility] Patient Name: Sandra Lin Procedure Date: 11/26/2018 8:13 AM MRN: [id number] Account #: [id number] Date of Birth: [birth year] Admit Type: Outpatient Age: 53 Room: [facility] ENDO ROOM 3 Gender: Female Note Status: Finalized Procedure:            Colonoscopy Indications:          Screening for colorectal malignant neoplasm Providers:            Jonathon Bellows MD, MD Referring MD:         Raelyn Ensign Medicines:            Monitored Anesthesia Care Complications:        No immediate complications. Procedure:            Pre-Anesthesia Assessment:                       - Prior to the procedure, a History and Physical was                        performed, and patient medications, allergies and                        sensitivities were reviewed. The patient's tolerance of                        previous anesthesia was reviewed.                       - The risks and benefits of the procedure and the                        sedation options and risks were discussed with the                        patient. All questions were answered and informed                        consent was obtained.                       - ASA Grade Assessment: II - A patient with mild                        systemic disease.                       After obtaining informed consent, the colonoscope was                        passed under direct vision. Throughout the procedure,                        the patient's blood pressure, pulse, and oxygen                        saturations were monitored continuously. The                        Colonoscope was introduced through the anus and                        advanced to  the the cecum, identified by the                        appendiceal orifice, IC valve and transillumination.                        The colonoscopy was performed with ease. The patient                        tolerated the procedure well. The quality of the bowel                      preparation was good. Findings:      The perianal and digital rectal examinations were normal.      Two sessile polyps were found in the sigmoid colon. The polyps were 4 to       6 mm in size. These polyps were removed with a cold snare. Resection and       retrieval were complete.      [Number] [Pedicle] polyps were found [Site]. [Size].      Two sessile polyps were found in the ascending colon. The polyps were 4       to 6 mm in size. These polyps were removed with a cold snare. Resection       and retrieval were complete.      A 3 mm polyp was found in the ascending colon. The polyp was sessile.       The polyp was removed with a cold biopsy forceps. Resection and       retrieval were complete.      Multiple small-mouthed diverticula were found in the sigmoid colon.      The exam was otherwise without abnormality on direct and retroflexion       views. Impression:           - Two 4 to 6 mm polyps in the sigmoid colon, removed                        with a cold snare. Resected and retrieved.                       - Multiple polyps.                       - Two 4 to 6 mm polyps in the ascending colon, removed                        with a cold snare. Resected and retrieved.                       - One 3 mm polyp in the ascending colon, removed with a                        cold biopsy forceps. Resected and retrieved.                       - Diverticulosis in the sigmoid colon.                       - The examination was otherwise normal on direct and  retroflexion views. Recommendation:       - Discharge patient to home (with escort).                       - Resume previous diet.                       - Continue present medications.                       - Await pathology results.                       - Repeat colonoscopy for surveillance of multiple                        polyps. Procedure Code(s):    --- Professional ---                        [phone], Colonoscopy, flexible; with removal of tumor(s),                        polyp(s), or other lesion(s) by snare technique                       45380, 64, Colonoscopy, flexible; with biopsy, single                        or multiple Diagnosis Code(s):    --- Professional ---                       Z12.11, Encounter for screening for malignant neoplasm                        of colon                       D12.5, Benign neoplasm of sigmoid colon                       D12.2, Benign neoplasm of ascending colon                       K57.30, Diverticulosis of large intestine without                        perforation or abscess without bleeding CPT copyright 2018 American Medical Association. All rights reserved. The codes documented in this report are preliminary and upon coder review may  be revised to meet current compliance requirements. Jonathon Bellows, MD Jonathon Bellows MD, MD 11/26/2018 8:44:47 AM This report has been signed electronically. Number of Addenda: 0 Note Initiated On: 11/26/2018 8:13 AM      [facility]

## 2018-11-26 NOTE — Transfer of Care (Signed)
Immediate Anesthesia Transfer of Care Note  Patient: Sandra Lin  Procedure(s) Performed: COLONOSCOPY WITH PROPOFOL (N/A )  Patient Location: PACU  Anesthesia Type:General  Level of Consciousness: awake and alert   Airway & Oxygen Therapy: Patient Spontanous Breathing  Post-op Assessment: Report given to RN and Post -op Vital signs reviewed and stable  Post vital signs: Reviewed and stable  Last Vitals:  Vitals Value Taken Time  BP 107/56 11/26/2018  8:46 AM  Temp 36.2 C 11/26/2018  8:45 AM  Pulse 58 11/26/2018  8:46 AM  Resp 15 11/26/2018  8:46 AM  SpO2 100 % 11/26/2018  8:46 AM  Vitals shown include unvalidated device data.  Last Pain:  Vitals:   11/26/18 0845  TempSrc: Tympanic         Complications: No apparent anesthesia complications

## 2018-11-26 NOTE — Anesthesia Post-op Follow-up Note (Signed)
Anesthesia QCDR form completed.        

## 2018-11-26 NOTE — Anesthesia Preprocedure Evaluation (Signed)
Anesthesia Evaluation  Patient identified by MRN, date of birth, ID band Patient awake    Reviewed: Allergy & Precautions, H&P , NPO status , Patient's Chart, lab work & pertinent test results, reviewed documented beta blocker date and time   History of Anesthesia Complications Negative for: history of anesthetic complications  Airway Mallampati: II  TM Distance: >3 FB Neck ROM: full    Dental  (+) Dental Advidsory Given, Missing, Teeth Intact   Pulmonary neg pulmonary ROS, former smoker,           Cardiovascular Exercise Tolerance: Good hypertension, (-) angina(-) CAD, (-) Past MI, (-) Cardiac Stents and (-) CABG (-) dysrhythmias + Valvular Problems/Murmurs      Neuro/Psych  Headaches, neg Seizures Subarachnoid hematoma CVA, No Residual Symptoms negative psych ROS   GI/Hepatic negative GI ROS, Neg liver ROS,   Endo/Other  negative endocrine ROS  Renal/GU negative Renal ROS  negative genitourinary   Musculoskeletal   Abdominal   Peds  Hematology negative hematology ROS (+)   Anesthesia Other Findings Past Medical History: No date: Hypertension No date: Migraine 2013: Subarachnoid hematoma ([facility])   Reproductive/Obstetrics negative OB ROS                             Anesthesia Physical Anesthesia Plan  ASA: II  Anesthesia Plan: General   Post-op Pain Management:    Induction: Intravenous  PONV Risk Score and Plan: 3 and Propofol infusion and TIVA  Airway Management Planned: Natural Airway and Nasal Cannula  Additional Equipment:   Intra-op Plan:   Post-operative Plan:   Informed Consent: I have reviewed the patients History and Physical, chart, labs and discussed the procedure including the risks, benefits and alternatives for the proposed anesthesia with the patient or authorized representative who has indicated his/her understanding and acceptance.     Dental Advisory  Given  Plan Discussed with: Anesthesiologist, CRNA and Surgeon  Anesthesia Plan Comments:         Anesthesia Quick Evaluation

## 2018-11-26 NOTE — Anesthesia Procedure Notes (Signed)
Date/Time: 11/26/2018 8:16 AM Performed by: Johnna Acosta, CRNA Pre-anesthesia Checklist: Patient identified, Emergency Drugs available, Suction available, Patient being monitored and Timeout performed Patient Re-evaluated:Patient Re-evaluated prior to induction Oxygen Delivery Method: Nasal cannula Preoxygenation: Pre-oxygenation with 100% oxygen Induction Type: IV induction

## 2018-11-27 LAB — SURGICAL PATHOLOGY

## 2018-12-01 ENCOUNTER — Other Ambulatory Visit: Payer: Self-pay | Admitting: Internal Medicine

## 2018-12-01 DIAGNOSIS — R011 Cardiac murmur, unspecified: Secondary | ICD-10-CM

## 2018-12-03 ENCOUNTER — Encounter: Payer: Self-pay | Admitting: Gastroenterology

## 2018-12-08 ENCOUNTER — Ambulatory Visit (INDEPENDENT_AMBULATORY_CARE_PROVIDER_SITE_OTHER): Payer: 59

## 2018-12-08 ENCOUNTER — Other Ambulatory Visit: Payer: Self-pay

## 2018-12-08 DIAGNOSIS — R011 Cardiac murmur, unspecified: Secondary | ICD-10-CM | POA: Diagnosis not present

## 2018-12-09 ENCOUNTER — Telehealth: Payer: Self-pay | Admitting: Internal Medicine

## 2018-12-09 ENCOUNTER — Telehealth: Payer: Self-pay | Admitting: Family Medicine

## 2018-12-09 ENCOUNTER — Telehealth: Payer: Self-pay | Admitting: Gastroenterology

## 2018-12-09 NOTE — Telephone Encounter (Signed)
Patient verbalized understanding of echo results

## 2018-12-09 NOTE — Telephone Encounter (Signed)
Pt would like echocardiogram results. Please call.

## 2018-12-09 NOTE — Telephone Encounter (Signed)
Called pt regarding her trouble understanding her colonoscopy biopsy results.  Unable to contact, left a detailed VM per pt request.

## 2018-12-09 NOTE — Telephone Encounter (Signed)
Patient called in stating she had received her colonoscopy results through her My Chart. She has ask that she get a call to further explain them. She would like to know if things are ok or not. When you call & if she is unable to answer please leave a message letting her know so she will understand.

## 2018-12-09 NOTE — Telephone Encounter (Signed)
Copied from [place] [phone]. Topic: General - Other >> Dec 09, 2018 12:19 PM Keene Breath wrote: Reason for CRM: Patient called to request that a nurse call her to explain some test results that she had done this week.  She does not understand the results and would like for the nurse to explain them to her.  CB# [phone]

## 2018-12-09 NOTE — Telephone Encounter (Signed)
Informed patient that she must speak to [facility] office regarding Colonoscopy and Called Heart care for Echo results.

## 2018-12-14 ENCOUNTER — Encounter: Payer: 59 | Admitting: Family Medicine

## 2019-01-15 ENCOUNTER — Telehealth: Payer: Self-pay | Admitting: *Deleted

## 2019-01-15 NOTE — Telephone Encounter (Signed)
Patient has upcoming appt 02/04/19. Attempted to reach her to switch to virtual visit. No answer. Left message to call back.

## 2019-01-27 NOTE — Telephone Encounter (Signed)
No answer. Left message to call back.   

## 2019-02-02 NOTE — Telephone Encounter (Signed)
LMOV to switch to virtual evisit

## 2019-02-04 ENCOUNTER — Ambulatory Visit: Payer: 59 | Admitting: Internal Medicine

## 2019-02-11 NOTE — Telephone Encounter (Signed)
Recall entered  °

## 2019-02-11 NOTE — Telephone Encounter (Signed)
Patient was a no show for her appointment. Closing this encounter.

## 2019-03-03 ENCOUNTER — Encounter: Payer: 59 | Admitting: Family Medicine

## 2019-05-03 ENCOUNTER — Ambulatory Visit: Payer: 59 | Admitting: Family Medicine

## 2019-05-20 DIAGNOSIS — Z20828 Contact with and (suspected) exposure to other viral communicable diseases: Secondary | ICD-10-CM | POA: Diagnosis not present

## 2019-05-23 ENCOUNTER — Other Ambulatory Visit: Payer: Self-pay | Admitting: Family Medicine

## 2019-05-23 DIAGNOSIS — I10 Essential (primary) hypertension: Secondary | ICD-10-CM

## 2019-06-17 ENCOUNTER — Other Ambulatory Visit: Payer: Self-pay | Admitting: Family Medicine

## 2019-06-17 DIAGNOSIS — I10 Essential (primary) hypertension: Secondary | ICD-10-CM

## 2019-06-17 NOTE — Telephone Encounter (Signed)
Requested medication (s) are due for refill today: yes  Requested medication (s) are on the active medication list: yes  Last refill:  05/23/2019  Future visit scheduled: no  Notes to clinic:  Review for refill  Requested Prescriptions  Pending Prescriptions Disp Refills   hydrochlorothiazide (HYDRODIURIL) 25 MG tablet [Pharmacy Med Name: HYDROCHLOROTHIAZIDE 25 MG TAB] 30 tablet 0    Sig: TAKE 1 TABLET BY MOUTH EVERY DAY     Cardiovascular: Diuretics - Thiazide Failed - 06/17/2019  2:32 PM      Failed - Last BP in normal range    BP Readings from Last 1 Encounters:  11/26/18 (!) 145/85         Failed - Valid encounter within last 6 months    Recent Outpatient Visits          7 months ago Essential hypertension   [place], Astrid Divine, FNP   10 months ago Cervical high risk human papillomavirus (HPV) DNA test positive   [place], Henriette             Passed - Ca in normal range and within 360 days    Calcium  Date Value Ref Range Status  07/29/2018 9.7 8.6 - 10.2 mg/dL Final   Calcium, Total  Date Value Ref Range Status  05/23/2013 9.2 8.5 - 10.1 mg/dL Final         Passed - Cr in normal range and within 360 days    Creat  Date Value Ref Range Status  07/29/2018 0.79 0.50 - 1.10 mg/dL Final         Passed - K in normal range and within 360 days    Potassium  Date Value Ref Range Status  07/29/2018 3.8 3.5 - 5.3 mmol/L Final  05/23/2013 3.4 (L) 3.5 - 5.1 mmol/L Final         Passed - Na in normal range and within 360 days    Sodium  Date Value Ref Range Status  07/29/2018 140 135 - 146 mmol/L Final  05/23/2013 138 136 - 145 mmol/L Final

## 2019-06-24 NOTE — Telephone Encounter (Signed)
Please schedule patient for follow up in the next 15 days.  

## 2019-06-24 NOTE — Telephone Encounter (Signed)
Not able to leave message. Limited amount of her prescription has been sent to pharmacy. Please schedule appt within the next 15days

## 2019-07-11 ENCOUNTER — Other Ambulatory Visit: Payer: Self-pay | Admitting: Family Medicine

## 2019-07-11 DIAGNOSIS — I10 Essential (primary) hypertension: Secondary | ICD-10-CM

## 2020-03-21 ENCOUNTER — Telehealth: Payer: Self-pay | Admitting: Internal Medicine

## 2020-03-21 NOTE — Telephone Encounter (Signed)
Patient has been contacted at least 3 times for a recall, recall has been deleted

## 2020-03-28 ENCOUNTER — Ambulatory Visit (INDEPENDENT_AMBULATORY_CARE_PROVIDER_SITE_OTHER): Payer: 59 | Admitting: Family Medicine

## 2020-03-28 ENCOUNTER — Encounter: Payer: Self-pay | Admitting: Family Medicine

## 2020-03-28 ENCOUNTER — Telehealth: Payer: Self-pay

## 2020-03-28 VITALS — BP 160/95 | HR 74 | Ht 65.0 in | Wt 200.4 lb

## 2020-03-28 DIAGNOSIS — I10 Essential (primary) hypertension: Secondary | ICD-10-CM | POA: Diagnosis not present

## 2020-03-28 NOTE — Progress Notes (Signed)
Name: Sandra Lin   MRN: [id number]    DOB: Apr 21, 1966   Date:03/28/2020       Progress Note  Subjective:    Chief Complaint  Chief Complaint  Patient presents with  . Hypertension  . Medication Refill    I connected with  Teresa Pelton  on 03/28/20 at 11:20 AM EDT by a video enabled telemedicine application and verified that I am speaking with the correct person using two identifiers.  I discussed the limitations of evaluation and management by telemedicine and the availability of in person appointments. The patient expressed understanding and agreed to proceed. Staff also discussed with the patient that there may be a patient responsible charge related to this service. Patient Location:  home Provider Location: [facility] clinic Additional Individuals present: none  HPI  Pt presents for HTN and med refills-  Pt new to me, not seen in clinic for over a year, labs done more than 2 years ago. HTN has been managed on HCTZ 25 mg, she really does not want to be on medications and would like to get off them. At home today BP was >180/100, then 160/95 and rechecked again around 11:30am was 153/99 She has a few bottles of older pills and she poured them all into one container, she is not sure if they are expired and she does not endorse taking daily.  Last VS checked: 153/99. Pulse 63 She says she was initially running around at home with grandkids when BP was high.  Pt very hesitant to take meds due to SE and she wants to do something more natural  I reviewed pts hx with is significant for HTN, prior atypical CP, hx of aneurysm, CVA, dyslipidemia, obesity, discussed multiple comorbidities and family hx which can contribute to chronic disease or conditions.  Per pt report today, weight up only 2 lbs since last OV Wt Readings from Last 5 Encounters:  03/28/20 200 lb 6.4 oz (90.9 kg)  11/26/18 198 lb (89.8 kg)  11/06/18 197 lb (89.4 kg)  10/30/18 197 lb 14.4 oz (89.8 kg)  07/29/18  193 lb (87.5 kg)   BMI Readings from Last 5 Encounters:  03/28/20 33.35 kg/m  11/26/18 32.95 kg/m  11/06/18 32.78 kg/m  10/30/18 30.09 kg/m  07/29/18 31.15 kg/m   BP Readings from Last 3 Encounters:  03/28/20 (!) 160/95  11/26/18 (!) 145/85  11/06/18 (!) 156/92   Hx of HTN being poorly controlled    Chemistry      Component Value Date/Time   NA 140 07/29/2018 1147   NA 138 05/23/2013 1837   K 3.8 07/29/2018 1147   K 3.4 (L) 05/23/2013 1837   CL 103 07/29/2018 1147   CL 106 05/23/2013 1837   CO2 29 07/29/2018 1147   CO2 29 05/23/2013 1837   BUN 15 07/29/2018 1147   BUN 6 (L) 05/23/2013 1837   CREATININE 0.79 07/29/2018 1147      Component Value Date/Time   CALCIUM 9.7 07/29/2018 1147   CALCIUM 9.2 05/23/2013 1837   ALKPHOS 46 01/15/2016 1129   ALKPHOS 65 05/23/2013 1837   AST 18 07/29/2018 1147   AST 21 05/23/2013 1837   ALT 20 07/29/2018 1147   ALT 23 05/23/2013 1837   BILITOT 0.5 07/29/2018 1147   BILITOT 0.5 05/23/2013 1837     Renal function recent labs:  Lab Results  Component Value Date   GFRAA 102 07/29/2018   GFRAA >60 01/15/2016   GFRAA >60 09/19/2015  Lab Results  Component Value Date   CREATININE 0.79 07/29/2018   BUN 15 07/29/2018   NA 140 07/29/2018   K 3.8 07/29/2018   CL 103 07/29/2018   CO2 29 07/29/2018      Patient Active Problem List   Diagnosis Date Noted  . Atypical chest pain 11/07/2018  . Memory deficit 10/30/2018  . History of aneurysm 10/30/2018  . Cervical high risk human papillomavirus (HPV) DNA test positive 07/29/2018  . Bilateral hearing loss 07/29/2018  . Essential hypertension 07/29/2018  . Morbid obesity ([place]) 07/29/2018  . Heart murmur 07/29/2018  . Allergic to shellfish 01/16/2016  . Dyslipidemia 01/16/2016  . Cerebrovascular accident, old 01/16/2016  . H/O iron deficiency anemia 01/16/2016  . Irregular bleeding 01/16/2016  . Lipoma of lower extremity 01/16/2016  . Class 1 obesity due to excess  calories with body mass index (BMI) of 31.0 to 31.9 in adult 01/16/2016  . Fibroid 01/16/2016  . Stress incontinence 01/16/2016  . Allergic rhinitis 12/19/2008    Social History   Tobacco Use  . Smoking status: Former Smoker    Packs/day: 1.00    Years: 14.00    Pack years: 14.00    Types: Cigarettes    Quit date: 1994    Years since quitting: 27.5  . Smokeless tobacco: Never Used  Substance Use Topics  . Alcohol use: Yes    Alcohol/week: 3.0 standard drinks    Types: 3 Glasses of wine per week     Current Outpatient Medications:  .  hydrochlorothiazide (HYDRODIURIL) 25 MG tablet, TAKE 1 TABLET BY MOUTH EVERY DAY (Patient not taking: Reported on 03/28/2020), Disp: 15 tablet, Rfl: 0  Allergies  Allergen Reactions  . Dust Mite Extract   . Molds & Smuts   . Shellfish Allergy Swelling    And rash   10 Systems reviewed and are negative for acute change except as noted in the HPI.   Review of Systems  Constitutional: Negative.   HENT: Negative.   Eyes: Negative.   Respiratory: Negative.   Cardiovascular: Negative.   Gastrointestinal: Negative.   Endocrine: Negative.   Genitourinary: Negative.   Musculoskeletal: Negative.   Skin: Negative.   Allergic/Immunologic: Negative.   Neurological: Negative.   Hematological: Negative.   Psychiatric/Behavioral: Negative.   All other systems reviewed and are negative.     Objective:   Virtual encounter, vitals limited, only able to obtain the following Today's Vitals   03/28/20 1054  BP: (!) 160/95  Pulse: 74  Weight: 200 lb 6.4 oz (90.9 kg)  Height: 5\' 5"  (1.651 m)   Body mass index is 33.35 kg/m. Nursing Note and Vital Signs reviewed.  Physical Exam Vitals and nursing note reviewed.  Neck:     Comments: Normal phonation Pulmonary:     Comments: No audible wheeze, stridor, or respiratory distress Neurological:     Mental Status: She is alert.        PE limited by telephone encounter  No results found  for this or any previous visit (from the past 72 hour(s)).  Assessment and Plan:     ICD-10-CM   1. Essential hypertension  I10    HTN uncontrolled, discussed diet/lifestyle efforts (weight loss, DASH) explained pt would need additional med/change to get controlled, at risk     I explained risk of end organ damage, renal disease, increased CVD risk, MI/stroke.  I asked pt to come into clinic for exam and labs.  I explained that I would labs to  monitor meds, renal function, electrolytes - at least once a year.  I explained that I would like to add an additional med for renal protection, but pt was adamant about no more meds and stopping her current meds.  She would like something more "natural".  I explained lifestyle efforts, diet, weight loss and other things she could work on, but also explained that there are genetic components and/or other disease processes which may continue to cause higher BP which would require med tx to prevent long term neg effects of high BP (like HF, arrhythmias, renal disease etc).    Explained pt could get info on AVS today through mychart.  She was invited to come into clinic to discuss further, get labs, allow me to meet and examine her in person etc.  She declined for staff to call her back to make a f/up appt.  I reviewed high blood pressure risks multiple times today, explained that she is at risk with higher elevated BP and explained that goal BP would be <130/80.  I also explained that I could send in a combo pill with losartan-HCTZ or lisinopril-HCTZ so that she did not have increased pill burden but improved BP control, she was not interested.    - I discussed the assessment and treatment plan with the patient. The patient was provided an opportunity to ask questions and all were answered. The patient agreed with the plan and demonstrated an understanding of the instructions.  I provided 20 minutes of non-face-to-face time during this encounter.  Delsa Grana, PA-C 03/28/20 12:05 PM

## 2020-03-28 NOTE — Patient Instructions (Signed)
DASH Eating Plan DASH stands for "Dietary Approaches to Stop Hypertension." The DASH eating plan is a healthy eating plan that has been shown to reduce high blood pressure (hypertension). It may also reduce your risk for type 2 diabetes, heart disease, and stroke. The DASH eating plan may also help with weight loss. What are tips for following this plan?  General guidelines  Avoid eating more than 2,300 mg (milligrams) of salt (sodium) a day. If you have hypertension, you may need to reduce your sodium intake to 1,500 mg a day.  Limit alcohol intake to no more than 1 drink a day for nonpregnant women and 2 drinks a day for men. One drink equals 12 oz of beer, 5 oz of wine, or 1 oz of hard liquor.  Work with your health care provider to maintain a healthy body weight or to lose weight. Ask what an ideal weight is for you.  Get at least 30 minutes of exercise that causes your heart to beat faster (aerobic exercise) most days of the week. Activities may include walking, swimming, or biking.  Work with your health care provider or diet and nutrition specialist (dietitian) to adjust your eating plan to your individual calorie needs. Reading food labels   Check food labels for the amount of sodium per serving. Choose foods with less than 5 percent of the Daily Value of sodium. Generally, foods with less than 300 mg of sodium per serving fit into this eating plan.  To find whole grains, look for the word "whole" as the first word in the ingredient list. Shopping  Buy products labeled as "low-sodium" or "no salt added."  Buy fresh foods. Avoid canned foods and premade or frozen meals. Cooking  Avoid adding salt when cooking. Use salt-free seasonings or herbs instead of table salt or sea salt. Check with your health care provider or pharmacist before using salt substitutes.  Do not fry foods. Cook foods using healthy methods such as baking, boiling, grilling, and broiling instead.  Cook with  heart-healthy oils, such as olive, canola, soybean, or sunflower oil. Meal planning  Eat a balanced diet that includes: ? 5 or more servings of fruits and vegetables each day. At each meal, try to fill half of your plate with fruits and vegetables. ? Up to 6-8 servings of whole grains each day. ? Less than 6 oz of lean meat, poultry, or fish each day. A 3-oz serving of meat is about the same size as a deck of cards. One egg equals 1 oz. ? 2 servings of low-fat dairy each day. ? A serving of nuts, seeds, or beans 5 times each week. ? Heart-healthy fats. Healthy fats called Omega-3 fatty acids are found in foods such as flaxseeds and coldwater fish, like sardines, salmon, and mackerel.  Limit how much you eat of the following: ? Canned or prepackaged foods. ? Food that is high in trans fat, such as fried foods. ? Food that is high in saturated fat, such as fatty meat. ? Sweets, desserts, sugary drinks, and other foods with added sugar. ? Full-fat dairy products.  Do not salt foods before eating.  Try to eat at least 2 vegetarian meals each week.  Eat more home-cooked food and less restaurant, buffet, and fast food.  When eating at a restaurant, ask that your food be prepared with less salt or no salt, if possible. What foods are recommended? The items listed may not be a complete list. Talk with your dietitian about   what dietary choices are best for you. Grains Whole-grain or whole-wheat bread. Whole-grain or whole-wheat pasta. Brown rice. Oatmeal. Quinoa. Bulgur. Whole-grain and low-sodium cereals. Pita bread. Low-fat, low-sodium crackers. Whole-wheat flour tortillas. Vegetables Fresh or frozen vegetables (raw, steamed, roasted, or grilled). Low-sodium or reduced-sodium tomato and vegetable juice. Low-sodium or reduced-sodium tomato sauce and tomato paste. Low-sodium or reduced-sodium canned vegetables. Fruits All fresh, dried, or frozen fruit. Canned fruit in natural juice (without  added sugar). Meat and other protein foods Skinless chicken or turkey. Ground chicken or turkey. Pork with fat trimmed off. Fish and seafood. Egg whites. Dried beans, peas, or lentils. Unsalted nuts, nut butters, and seeds. Unsalted canned beans. Lean cuts of beef with fat trimmed off. Low-sodium, lean deli meat. Dairy Low-fat (1%) or fat-free (skim) milk. Fat-free, low-fat, or reduced-fat cheeses. Nonfat, low-sodium ricotta or cottage cheese. Low-fat or nonfat yogurt. Low-fat, low-sodium cheese. Fats and oils Soft margarine without trans fats. Vegetable oil. Low-fat, reduced-fat, or light mayonnaise and salad dressings (reduced-sodium). Canola, safflower, olive, soybean, and sunflower oils. Avocado. Seasoning and other foods Herbs. Spices. Seasoning mixes without salt. Unsalted popcorn and pretzels. Fat-free sweets. What foods are not recommended? The items listed may not be a complete list. Talk with your dietitian about what dietary choices are best for you. Grains Baked goods made with fat, such as croissants, muffins, or some breads. Dry pasta or rice meal packs. Vegetables Creamed or fried vegetables. Vegetables in a cheese sauce. Regular canned vegetables (not low-sodium or reduced-sodium). Regular canned tomato sauce and paste (not low-sodium or reduced-sodium). Regular tomato and vegetable juice (not low-sodium or reduced-sodium). Pickles. Olives. Fruits Canned fruit in a light or heavy syrup. Fried fruit. Fruit in cream or butter sauce. Meat and other protein foods Fatty cuts of meat. Ribs. Fried meat. Bacon. Sausage. Bologna and other processed lunch meats. Salami. Fatback. Hotdogs. Bratwurst. Salted nuts and seeds. Canned beans with added salt. Canned or smoked fish. Whole eggs or egg yolks. Chicken or turkey with skin. Dairy Whole or 2% milk, cream, and half-and-half. Whole or full-fat cream cheese. Whole-fat or sweetened yogurt. Full-fat cheese. Nondairy creamers. Whipped toppings.  Processed cheese and cheese spreads. Fats and oils Butter. Stick margarine. Lard. Shortening. Ghee. Bacon fat. Tropical oils, such as coconut, palm kernel, or palm oil. Seasoning and other foods Salted popcorn and pretzels. Onion salt, garlic salt, seasoned salt, table salt, and sea salt. Worcestershire sauce. Tartar sauce. Barbecue sauce. Teriyaki sauce. Soy sauce, including reduced-sodium. Steak sauce. Canned and packaged gravies. Fish sauce. Oyster sauce. Cocktail sauce. Horseradish that you find on the shelf. Ketchup. Mustard. Meat flavorings and tenderizers. Bouillon cubes. Hot sauce and Tabasco sauce. Premade or packaged marinades. Premade or packaged taco seasonings. Relishes. Regular salad dressings. Where to find more information:  National Heart, Lung, and Blood Institute: www.nhlbi.nih.gov  American Heart Association: www.heart.org Summary  The DASH eating plan is a healthy eating plan that has been shown to reduce high blood pressure (hypertension). It may also reduce your risk for type 2 diabetes, heart disease, and stroke.  With the DASH eating plan, you should limit salt (sodium) intake to 2,300 mg a day. If you have hypertension, you may need to reduce your sodium intake to 1,500 mg a day.  When on the DASH eating plan, aim to eat more fresh fruits and vegetables, whole grains, lean proteins, low-fat dairy, and heart-healthy fats.  Work with your health care provider or diet and nutrition specialist (dietitian) to adjust your eating plan to your   individual calorie needs. This information is not intended to replace advice given to you by your health care provider. Make sure you discuss any questions you have with your health care provider. Document Revised: 08/22/2017 Document Reviewed: 09/02/2016 Elsevier Patient Education  [place].     Preventing Hypertension Hypertension, commonly called high blood pressure, is when the force of blood pumping through the  arteries is too strong. Arteries are blood vessels that carry blood from the heart throughout the body. Over time, hypertension can damage the arteries and decrease blood flow to important parts of the body, including the brain, heart, and kidneys. Often, hypertension does not cause symptoms until blood pressure is very high. For this reason, it is important to have your blood pressure checked on a regular basis. Hypertension can often be prevented with diet and lifestyle changes. If you already have hypertension, you can control it with diet and lifestyle changes, as well as medicine. What nutrition changes can be made? Maintain a healthy diet. This includes:  Eating less salt (sodium). Ask your health care provider how much sodium is safe for you to have. The general recommendation is to consume less than 1 tsp (2,300 mg) of sodium a day. ? Do not add salt to your food. ? Choose low-sodium options when grocery shopping and eating out.  Limiting fats in your diet. You can do this by eating low-fat or fat-free dairy products and by eating less red meat.  Eating more fruits, vegetables, and whole grains. Make a goal to eat: ? 1-2 cups of fresh fruits and vegetables each day. ? 3-4 servings of whole grains each day.  Avoiding foods and beverages that have added sugars.  Eating fish that contain healthy fats (omega-3 fatty acids), such as mackerel or salmon. If you need help putting together a healthy eating plan, try the DASH diet. This diet is high in fruits, vegetables, and whole grains. It is low in sodium, red meat, and added sugars. DASH stands for Dietary Approaches to Stop Hypertension. What lifestyle changes can be made?   Lose weight if you are overweight. Losing just 3?5% of your body weight can help prevent or control hypertension. ? For example, if your present weight is 200 lb (91 kg), a loss of 3-5% of your weight means losing 6-10 lb (2.7-4.5 kg). ? Ask your health care provider  to help you with a diet and exercise plan to safely lose weight.  Get enough exercise. Do at least 150 minutes of moderate-intensity exercise each week. ? You could do this in short exercise sessions several times a day, or you could do longer exercise sessions a few times a week. For example, you could take a brisk 10-minute walk or bike ride, 3 times a day, for 5 days a week.  Find ways to reduce stress, such as exercising, meditating, listening to music, or taking a yoga class. If you need help reducing stress, ask your health care provider.  Do not smoke. This includes e-cigarettes. Chemicals in tobacco and nicotine products raise your blood pressure each time you smoke. If you need help quitting, ask your health care provider.  Avoid alcohol. If you drink alcohol, limit alcohol intake to no more than 1 drink a day for nonpregnant women and 2 drinks a day for men. One drink equals 12 oz of beer, 5 oz of wine, or 1 oz of hard liquor. Why are these changes important? Diet and lifestyle changes can help you prevent hypertension, and  they may make you feel better overall and improve your quality of life. If you have hypertension, making these changes will help you control it and help prevent major complications, such as:  Hardening and narrowing of arteries that supply blood to: ? Your heart. This can cause a heart attack. ? Your brain. This can cause a stroke. ? Your kidneys. This can cause kidney failure.  Stress on your heart muscle, which can cause heart failure. What can I do to lower my risk?  Work with your health care provider to make a hypertension prevention plan that works for you. Follow your plan and keep all follow-up visits as told by your health care provider.  Learn how to check your blood pressure at home. Make sure that you know your personal target blood pressure, as told by your health care provider. How is this treated? In addition to diet and lifestyle changes, your  health care provider may recommend medicines to help lower your blood pressure. You may need to try a few different medicines to find what works best for you. You also may need to take more than one medicine. Take over-the-counter and prescription medicines only as told by your health care provider. Where to find support Your health care provider can help you prevent hypertension and help you keep your blood pressure at a healthy level. Your local hospital or your community may also provide support services and prevention programs. The American Heart Association offers an online support network at: CheapBootlegs.com.cy Where to find more information Learn more about hypertension from:  [place], Lung, and Blood Institute: ElectronicHangman.is  Centers for Disease Control and Prevention: https://ingram.com/  American Academy of Family Physicians: http://familydoctor.org/familydoctor/en/diseases-conditions/high-blood-pressure.printerview.all.html Learn more about the DASH diet from:  [place], Lung, and [place]: https://www.reyes.com/ Contact a health care provider if:  You think you are having a reaction to medicines you have taken.  You have recurrent headaches or feel dizzy.  You have swelling in your ankles.  You have trouble with your vision. Summary  Hypertension often does not cause any symptoms until blood pressure is very high. It is important to get your blood pressure checked regularly.  Diet and lifestyle changes are the most important steps in preventing hypertension.  By keeping your blood pressure in a healthy range, you can prevent complications like heart attack, heart failure, stroke, and kidney failure.  Work with your health care provider to make a hypertension prevention plan that works for you. This information is not intended to replace advice given  to you by your health care provider. Make sure you discuss any questions you have with your health care provider. Document Revised: 01/01/2019 Document Reviewed: 05/20/2016 Elsevier Patient Education  2020 [employer].

## 2020-03-28 NOTE — Telephone Encounter (Signed)
done

## 2020-03-28 NOTE — Telephone Encounter (Signed)
Pt needs appt before refills 

## 2020-04-12 ENCOUNTER — Other Ambulatory Visit: Payer: Self-pay

## 2020-04-12 ENCOUNTER — Ambulatory Visit: Payer: Medicaid Other | Admitting: Family Medicine

## 2020-04-12 ENCOUNTER — Encounter: Payer: Self-pay | Admitting: Family Medicine

## 2020-04-12 VITALS — BP 124/82 | HR 75 | Temp 97.9°F | Resp 16 | Ht 65.0 in | Wt 200.8 lb

## 2020-04-12 DIAGNOSIS — N6320 Unspecified lump in the left breast, unspecified quadrant: Secondary | ICD-10-CM

## 2020-04-12 DIAGNOSIS — J310 Chronic rhinitis: Secondary | ICD-10-CM

## 2020-04-12 DIAGNOSIS — H9193 Unspecified hearing loss, bilateral: Secondary | ICD-10-CM

## 2020-04-12 DIAGNOSIS — J329 Chronic sinusitis, unspecified: Secondary | ICD-10-CM | POA: Diagnosis not present

## 2020-04-12 DIAGNOSIS — H6983 Other specified disorders of Eustachian tube, bilateral: Secondary | ICD-10-CM

## 2020-04-12 NOTE — Progress Notes (Signed)
Patient ID: NAMINE BEAHM, female    DOB: [birth year], 54 y.o.   MRN: [id number]  PCP: Hubbard Hartshorn, FNP  Chief Complaint  Patient presents with  . Ear Fullness    hard to hear    Subjective:   PHYLLIS ABELSON is a 54 y.o. female, presents to clinic with CC of the following:  HPI  Ear fullness for several weeks at least, feels blocked Decreased hearing over years Left ear is more sx than right No pain, nasal sx, sore throat Has not tried anything shes worried she needs ears cleaned out  Patient Active Problem List   Diagnosis Date Noted  . Atypical chest pain 11/07/2018  . Memory deficit 10/30/2018  . History of aneurysm 10/30/2018  . Cervical high risk human papillomavirus (HPV) DNA test positive 07/29/2018  . Bilateral hearing loss 07/29/2018  . Essential hypertension 07/29/2018  . Morbid obesity ([place]) 07/29/2018  . Heart murmur 07/29/2018  . Allergic to shellfish 01/16/2016  . Dyslipidemia 01/16/2016  . Cerebrovascular accident, old 01/16/2016  . H/O iron deficiency anemia 01/16/2016  . Irregular bleeding 01/16/2016  . Lipoma of lower extremity 01/16/2016  . Class 1 obesity due to excess calories with body mass index (BMI) of 31.0 to 31.9 in adult 01/16/2016  . Fibroid 01/16/2016  . Stress incontinence 01/16/2016  . Allergic rhinitis 12/19/2008      Current Outpatient Medications:  .  hydrochlorothiazide (HYDRODIURIL) 25 MG tablet, TAKE 1 TABLET BY MOUTH EVERY DAY, Disp: 15 tablet, Rfl: 0   Allergies  Allergen Reactions  . Dust Mite Extract   . Molds & Smuts   . Shellfish Allergy Swelling    And rash     Social History   Tobacco Use  . Smoking status: Former Smoker    Packs/day: 1.00    Years: 14.00    Pack years: 14.00    Types: Cigarettes    Quit date: 1994    Years since quitting: 27.5  . Smokeless tobacco: Never Used  Vaping Use  . Vaping Use: Never used  Substance Use Topics  . Alcohol use: Yes    Alcohol/week: 3.0 standard  drinks    Types: 3 Glasses of wine per week  . Drug use: No      Chart Review Today: I personally reviewed active problem list, medication list, allergies, family history, social history, health maintenance, notes from last encounter, lab results, imaging with the patient/caregiver today.   Review of Systems 10 Systems reviewed and are negative for acute change except as noted in the HPI.     Objective:   Vitals:   04/12/20 1054  BP: 124/82  Pulse: 75  Resp: 16  Temp: 97.9 F (36.6 C)  TempSrc: Temporal  SpO2: 95%  Weight: 200 lb 12.8 oz (91.1 kg)  Height: 5\' 5"  (1.651 m)    Body mass index is 33.41 kg/m.  Physical Exam Vitals and nursing note reviewed.  Constitutional:      General: She is not in acute distress.    Appearance: Normal appearance. She is well-developed. She is obese. She is not ill-appearing, toxic-appearing or diaphoretic.  HENT:     Head: Normocephalic and atraumatic.     Jaw: There is normal jaw occlusion.     Right Ear: Hearing, ear canal and external ear normal. A middle ear effusion is present. There is no impacted cerumen. No mastoid tenderness. Tympanic membrane is not injected, scarred or erythematous.     Left Ear:  Hearing, tympanic membrane, ear canal and external ear normal.  No middle ear effusion. There is no impacted cerumen. No mastoid tenderness. Tympanic membrane is not injected, scarred or erythematous.     Ears:     Comments: Grossly normal hearing, unable to do hearing screening test    Nose: Mucosal edema, congestion and rhinorrhea present.     Right Turbinates: Enlarged and swollen.     Left Turbinates: Enlarged and swollen.     Right Sinus: No maxillary sinus tenderness or frontal sinus tenderness.     Left Sinus: No maxillary sinus tenderness or frontal sinus tenderness.     Mouth/Throat:     Mouth: Mucous membranes are moist. Mucous membranes are not pale.     Pharynx: Uvula midline. Posterior oropharyngeal erythema present.  No oropharyngeal exudate or uvula swelling.     Tonsils: No tonsillar abscesses.  Eyes:     General:        Right eye: No discharge.        Left eye: No discharge.     Conjunctiva/sclera: Conjunctivae normal.     Pupils: Pupils are equal, round, and reactive to light.  Neck:     Trachea: No tracheal deviation.  Cardiovascular:     Rate and Rhythm: Normal rate and regular rhythm.     Heart sounds: Normal heart sounds.  Pulmonary:     Effort: Pulmonary effort is normal. No respiratory distress.     Breath sounds: No stridor. No wheezing, rhonchi or rales.  Chest:     Breasts:        Right: No swelling, bleeding, inverted nipple, mass, nipple discharge, skin change or tenderness.        Left: Tenderness present. No bleeding, inverted nipple, mass, nipple discharge or skin change.     Comments: Slight fullness and tenderness to 2-3 o'clock position to left breast Abdominal:     General: Bowel sounds are normal. There is no distension.     Palpations: Abdomen is soft.  Musculoskeletal:        General: Normal range of motion.     Cervical back: Normal range of motion and neck supple.  Lymphadenopathy:     Upper Body:     Right upper body: No supraclavicular, axillary or pectoral adenopathy.     Left upper body: No supraclavicular, axillary or pectoral adenopathy.  Skin:    General: Skin is warm and dry.     Coloration: Skin is not pale.     Findings: No rash.  Neurological:     Mental Status: She is alert.     Motor: No abnormal muscle tone.     Coordination: Coordination normal.  Psychiatric:        Behavior: Behavior normal.          Results for orders placed or performed during the hospital encounter of 11/26/18  Pregnancy, urine POC  Result Value Ref Range   Preg Test, Ur NEGATIVE NEGATIVE  Surgical pathology  Result Value Ref Range   SURGICAL PATHOLOGY      Surgical Pathology CASE: [id number] PATIENT: [facility] Surgical Pathology  Report     SPECIMEN SUBMITTED: A. Colon polyps x3, ascending; cbx(1)/cold snare(2) B. Colon polyp x2, sigmoid; cold snare  CLINICAL HISTORY: None provided  PRE-OPERATIVE DIAGNOSIS: Screening colonoscopy  POST-OPERATIVE DIAGNOSIS: Colon polyps, diverticulosis     DIAGNOSIS: A.  COLON POLYPS X3, ASCENDING; COLD BIOPSY (1) AND COLD SNARE (2): - SESSILE SERRATED POLYP (MULTIPLE FRAGMENTS). - NEGATIVE FOR DYSPLASIA  AND MALIGNANCY.  B.  COLON POLYP X2, SIGMOID; COLD SNARE: - SERRATED POLYP, CANNOT EXCLUDE EARLY SESSILE SERRATED POLYP (1). - HYPERPLASTIC POLYP (1). - NEGATIVE FOR DYSPLASIA AND MALIGNANCY.  Comment: ``Sessile serrated polyp is synonymous with 2019 WHO new term ``sessile serrated lesion and replaces ``sessile serrated adenoma. The criteria for serrated polyposis syndrome (SPS) have been recently revised. Any histologic subtype of serrated polyp (hyperpla stic polyp, sessile serrated polyp with or without high-grade dysplasia, traditional serrated adenoma, and serrated polyp NOS) is included in the final polyp count. The polyp count is cumulative over multiple colonoscopies.  Clinical criteria for the diagnosis of serrated polyposis syndrome (SPS) includes: Criterion 1: At least 5 serrated polyps proximal to rectum, all at least 5 mm in size, with 2 or more measuring at least 10 mm; or  Criterion 2: More than 20 serrated polyps of any size distributed throughout the large bowel, with at least 5 located proximal to the rectum.  References: 2019 WHO Classification of Digestive System Tumors, 5th Edition.  GROSS DESCRIPTION: A. Labeled: Ascending colon polyp C BX x1 and cold snare x2 Received: Formalin Tissue fragment(s): Multiple Size: Aggregate, 0.7 x 0.4 x 0.1 cm Description: Tan soft tissue fragments Entirely submitted in 1 cassette.  B. Labeled: Sigmoid colon polyp cold snare x2 Received: Formalin Tissue fra gment(s): Multiple Size:  Aggregate, 0.7 x 0.5 x 0.1 cm Description: Tan soft tissue fragments admixed with fecal material Entirely submitted in 1 cassette.   Final Diagnosis performed by Quay Burow, MD.   Electronically signed 11/27/2018 9:50:18AM The electronic signature indicates that the named Attending Pathologist has evaluated the specimen  Technical component performed at [facility], [address], [place], [place] [address] Lab: [phone] Dir: Rush Farmer, MD, MMM  Professional component performed at [facility], [facility], [place], [address], [place] [address] Lab: [phone] Dir: Dellia Nims. Reuel Derby, MD        Assessment & Plan:   1. Dysfunction of both eustachian tubes Start daily antihistamine, intranasal steroids and decongestants - Ambulatory referral to ENT  2. Decreased hearing of both ears Unable to do hearing screen today due to device malfunction in office- suspect ETD - she is concerned with nearly 2 years of progressive hearing loss - people telling her the TV is too loud, etc.  She also was very concerned ears needed to be cleaned out - reassured to cerumen impaction - Ambulatory referral to ENT  3. Rhinosinusitis Unclear if viral + allergies?  Very diffusely edematous and erythematous w/o many nasal sx - Ambulatory referral to ENT  For #1-3 pt given instructions: Start a daily antihistamine (like zyrtec claritin allegra etc) and a daily steroid nasal spray like flonase or nasonex   And get and take decongestants  Follow up with ENT for further evaluation  Also given handout on Eustachian Tube dysfunction - expect that her ear sx may improve in the next 1-3 weeks    4. Left breast lump Mentioned when I was leaving the room today - not mentioned at all with rooming or making appt.  She talked to a nurse somewhere about her concern of tenderness and bump that she noticed over the past week?  No discrete mass or asymmetry.  Some dense breast tissue that she  notes is tender - imaging ordered and pt given info on how to call and get diagnostic imaging completed - [facility] Digital Diagnostic Bilat; Future - US BREAST COMPLETE UNI LEFT INC AXILLA; Future  Delsa Grana, PA-C 04/12/20 11:29 AM

## 2020-04-12 NOTE — Patient Instructions (Addendum)
Start a daily antihistamine (like zyrtec claritin allegra etc) and a daily steroid nasal spray like flonase or nasonex   And get and take decongestants  Follow up with ENT for further evaluation   Eustachian Tube Dysfunction  Eustachian tube dysfunction refers to a condition in which a blockage develops in the narrow passage that connects the middle ear to the back of the nose (eustachian tube). The eustachian tube regulates air pressure in the middle ear by letting air move between the ear and nose. It also helps to drain fluid from the middle ear space. Eustachian tube dysfunction can affect one or both ears. When the eustachian tube does not function properly, air pressure, fluid, or both can build up in the middle ear. What are the causes? This condition occurs when the eustachian tube becomes blocked or cannot open normally. Common causes of this condition include:  Ear infections.  Colds and other infections that affect the nose, mouth, and throat (upper respiratory tract).  Allergies.  Irritation from cigarette smoke.  Irritation from stomach acid coming up into the esophagus (gastroesophageal reflux). The esophagus is the tube that carries food from the mouth to the stomach.  Sudden changes in air pressure, such as from descending in an airplane or scuba diving.  Abnormal growths in the nose or throat, such as: ? Growths that line the nose (nasal polyps). ? Abnormal growth of cells (tumors). ? Enlarged tissue at the back of the throat (adenoids). What increases the risk? You are more likely to develop this condition if:  You smoke.  You are overweight.  You are a child who has: ? Certain birth defects of the mouth, such as cleft palate. ? Large tonsils or adenoids. What are the signs or symptoms? Common symptoms of this condition include:  A feeling of fullness in the ear.  Ear pain.  Clicking or popping noises in the ear.  Ringing in the ear.  Hearing  loss.  Loss of balance.  Dizziness. Symptoms may get worse when the air pressure around you changes, such as when you travel to an area of high elevation, fly on an airplane, or go scuba diving. How is this diagnosed? This condition may be diagnosed based on:  Your symptoms.  A physical exam of your ears, nose, and throat.  Tests, such as those that measure: ? The movement of your eardrum (tympanogram). ? Your hearing (audiometry). How is this treated? Treatment depends on the cause and severity of your condition.  In mild cases, you may relieve your symptoms by moving air into your ears. This is called "popping the ears."  In more severe cases, or if you have symptoms of fluid in your ears, treatment may include: ? Medicines to relieve congestion (decongestants). ? Medicines that treat allergies (antihistamines). ? Nasal sprays or ear drops that contain medicines that reduce swelling (steroids). ? A procedure to drain the fluid in your eardrum (myringotomy). In this procedure, a small tube is placed in the eardrum to:  Drain the fluid.  Restore the air in the middle ear space. ? A procedure to insert a balloon device through the nose to inflate the opening of the eustachian tube (balloon dilation). Follow these instructions at home: Lifestyle  Do not do any of the following until your health care provider approves: ? Travel to high altitudes. ? Fly in airplanes. ? Work in a Pension scheme manager or room. ? Scuba dive.  Do not use any products that contain nicotine or tobacco, such  as cigarettes and e-cigarettes. If you need help quitting, ask your health care provider.  Keep your ears dry. Wear fitted earplugs during showering and bathing. Dry your ears completely after. General instructions  Take over-the-counter and prescription medicines only as told by your health care provider.  Use techniques to help pop your ears as recommended by your health care provider. These  may include: ? Chewing gum. ? Yawning. ? Frequent, forceful swallowing. ? Closing your mouth, holding your nose closed, and gently blowing as if you are trying to blow air out of your nose.  Keep all follow-up visits as told by your health care provider. This is important. Contact a health care provider if:  Your symptoms do not go away after treatment.  Your symptoms come back after treatment.  You are unable to pop your ears.  You have: ? A fever. ? Pain in your ear. ? Pain in your head or neck. ? Fluid draining from your ear.  Your hearing suddenly changes.  You become very dizzy.  You lose your balance. Summary  Eustachian tube dysfunction refers to a condition in which a blockage develops in the eustachian tube.  It can be caused by ear infections, allergies, inhaled irritants, or abnormal growths in the nose or throat.  Symptoms include ear pain, hearing loss, or ringing in the ears.  Mild cases are treated with maneuvers to unblock the ears, such as yawning or ear popping.  Severe cases are treated with medicines. Surgery may also be done (rare). This information is not intended to replace advice given to you by your health care provider. Make sure you discuss any questions you have with your health care provider. Document Revised: 12/30/2017 Document Reviewed: 12/30/2017 Elsevier Patient Education  2020 [employer].    Call to see if they can schedule your mammogram and ultrasound [facility] [place],  [place]  [phone] Get Driving Directions Main: [phone]

## 2020-04-17 ENCOUNTER — Encounter: Payer: Self-pay | Admitting: Family Medicine

## 2020-05-04 ENCOUNTER — Ambulatory Visit
Admission: RE | Admit: 2020-05-04 | Discharge: 2020-05-04 | Disposition: A | Discharge status: Home / Self Care | Payer: 59 | Source: Ambulatory Visit | Attending: Family Medicine | Admitting: Family Medicine

## 2020-05-04 ENCOUNTER — Other Ambulatory Visit: Payer: Self-pay

## 2020-05-04 DIAGNOSIS — N6320 Unspecified lump in the left breast, unspecified quadrant: Secondary | ICD-10-CM

## 2020-05-04 DIAGNOSIS — N6489 Other specified disorders of breast: Secondary | ICD-10-CM | POA: Diagnosis not present

## 2020-05-04 DIAGNOSIS — R928 Other abnormal and inconclusive findings on diagnostic imaging of breast: Secondary | ICD-10-CM | POA: Diagnosis not present

## 2020-08-28 ENCOUNTER — Emergency Department: Payer: 59

## 2020-08-28 ENCOUNTER — Other Ambulatory Visit: Payer: Self-pay

## 2020-08-28 DIAGNOSIS — Z79899 Other long term (current) drug therapy: Secondary | ICD-10-CM | POA: Diagnosis not present

## 2020-08-28 DIAGNOSIS — I10 Essential (primary) hypertension: Secondary | ICD-10-CM | POA: Insufficient documentation

## 2020-08-28 DIAGNOSIS — U071 COVID-19: Secondary | ICD-10-CM | POA: Diagnosis not present

## 2020-08-28 DIAGNOSIS — R0602 Shortness of breath: Secondary | ICD-10-CM | POA: Diagnosis not present

## 2020-08-28 DIAGNOSIS — Z87891 Personal history of nicotine dependence: Secondary | ICD-10-CM | POA: Insufficient documentation

## 2020-08-28 DIAGNOSIS — R509 Fever, unspecified: Secondary | ICD-10-CM | POA: Diagnosis present

## 2020-08-28 LAB — CBC
HCT: 42.8 % (ref 36.0–46.0)
Hemoglobin: 14.9 g/dL (ref 12.0–15.0)
MCH: 30.8 pg (ref 26.0–34.0)
MCHC: 34.8 g/dL (ref 30.0–36.0)
MCV: 88.4 fL (ref 80.0–100.0)
Platelets: 133 10*3/uL — ABNORMAL LOW (ref 150–400)
RBC: 4.84 MIL/uL (ref 3.87–5.11)
RDW: 13.4 % (ref 11.5–15.5)
WBC: 7.4 10*3/uL (ref 4.0–10.5)
nRBC: 0 % (ref 0.0–0.2)

## 2020-08-28 LAB — BASIC METABOLIC PANEL
Anion gap: 13 (ref 5–15)
BUN: 12 mg/dL (ref 6–20)
CO2: 25 mmol/L (ref 22–32)
Calcium: 9 mg/dL (ref 8.9–10.3)
Chloride: 99 mmol/L (ref 98–111)
Creatinine, Ser: 0.99 mg/dL (ref 0.44–1.00)
GFR, Estimated: >60 mL/min (ref >60)
Glucose, Bld: 143 mg/dL — ABNORMAL HIGH (ref 70–99)
Potassium: 3.5 mmol/L (ref 3.5–5.1)
Sodium: 137 mmol/L (ref 135–145)

## 2020-08-28 LAB — RESP PANEL BY RT-PCR (FLU A&B, COVID) ARPGX2
Influenza A by PCR: NEGATIVE
Influenza B by PCR: NEGATIVE
SARS Coronavirus 2 by RT PCR: POSITIVE — AB

## 2020-08-28 MED ORDER — ONDANSETRON HCL 4 MG PO TABS
4.0000 mg | ORAL_TABLET | Freq: Once | ORAL | Status: AC
  Administered 2020-08-28: 4 mg via ORAL
  Filled 2020-08-28: qty 1

## 2020-08-28 NOTE — ED Triage Notes (Signed)
Pt here with SOB and chills. Pt's husband was admitted here today for the same diagnosis. Pt NAD in triage.

## 2020-08-29 ENCOUNTER — Emergency Department
Admission: EM | Admit: 2020-08-29 | Discharge: 2020-08-29 | Disposition: A | Discharge status: Home / Self Care | Payer: 59 | Attending: Emergency Medicine | Admitting: Emergency Medicine

## 2020-08-29 ENCOUNTER — Telehealth: Payer: Self-pay | Admitting: Nurse Practitioner

## 2020-08-29 DIAGNOSIS — U071 COVID-19: Secondary | ICD-10-CM

## 2020-08-29 MED ORDER — ONDANSETRON 4 MG PO TBDP: ORAL_TABLET | ORAL | 0 refills | Status: DC

## 2020-08-29 MED ORDER — ALBUTEROL SULFATE HFA 108 (90 BASE) MCG/ACT IN AERS: INHALATION_SPRAY | RESPIRATORY_TRACT | 0 refills | Status: DC

## 2020-08-29 MED ORDER — PREDNISONE 10 MG PO TABS: ORAL_TABLET | ORAL | 0 refills | Status: DC

## 2020-08-29 NOTE — Discharge Instructions (Addendum)
As we discussed, although you have tested positive for COVID-19 (coronavirus), you do not need to be hospitalized at this time.  Read through all the included information including the recommendations from the CDC.  We recommend that you self-quarantine at home with your immediate family only (people with whom you have already been in contact) for 10-14 days after your fever has gone away (without taking medication to make your temperature come down, such as Tylenol (acetaminophen)), after your respiratory symptoms have improved, and after at least 14 days have passed since your symptoms first appeared.  You should have as minimal contact as possible with anyone else including close family as per the [facility] paperwork guidelines listed below. Follow-up with your doctor by phone or online as needed and return immediately to the emergency department or call 911 only if you develop new or worsening symptoms that concern you.  If you were prescribed any medications, please use them as instructed.  If you were given information for the COVID-19 antibody infusion treatment clinic, please call them and leave your contact information.  This can be a very effective and important treatment method, and you should discuss with them if you qualify for treatment.  They may even have transportation services available to them from [place] if you require them.  You can find up-to-date information about COVID-19 in [place] by calling the [place]: [phone]. You may also call 2-1-1, or [phone], or additional resources.  You can also find information online at [email or web address]-white.[email or web address] or on the Center for Disease Control (CDC) website at BeginnerSteps.be.

## 2020-08-29 NOTE — ED Provider Notes (Signed)
[facility] Emergency Department Provider Note  ____________________________________________   First MD Initiated Contact with Patient 08/29/20 0155     (approximate)  I have reviewed the triage vital signs and the nursing notes.   HISTORY  Chief Complaint Shortness of Breath    HPI Sandra Lin is a 54 y.o. female with medical history as listed below who presents for evaluation of fever, body aches, shortness of breath, cough, decreased appetite, and nausea.  The symptoms have been going on 6 to 7 days, started mild but have become severe.  She is unvaccinated for COVID-19 and her husband tested positive and was admitted to the hospital within the last 24 hours.  The patient reports her mother also has similar symptoms.  Nothing particular makes the symptoms better, and exertion seems to make them worse, And she describes them as severe.  She has no history of diabetes or blood clots in the legs of the lungs.  She is not having any pain in her chest.           Past Medical History:  Diagnosis Date  . Hypertension   . Migraine   . Subarachnoid hematoma ([place]) 2013    Patient Active Problem List   Diagnosis Date Noted  . Atypical chest pain 11/07/2018  . Memory deficit 10/30/2018  . History of aneurysm 10/30/2018  . Cervical high risk human papillomavirus (HPV) DNA test positive 07/29/2018  . Bilateral hearing loss 07/29/2018  . Essential hypertension 07/29/2018  . Morbid obesity ([place]) 07/29/2018  . Heart murmur 07/29/2018  . Allergic to shellfish 01/16/2016  . Dyslipidemia 01/16/2016  . Cerebrovascular accident, old 01/16/2016  . H/O iron deficiency anemia 01/16/2016  . Irregular bleeding 01/16/2016  . Lipoma of lower extremity 01/16/2016  . Class 1 obesity due to excess calories with body mass index (BMI) of 31.0 to 31.9 in adult 01/16/2016  . Fibroid 01/16/2016  . Stress incontinence 01/16/2016  . Allergic rhinitis 12/19/2008     Past Surgical History:  Procedure Laterality Date  . COLONOSCOPY WITH PROPOFOL N/A 11/26/2018   Procedure: COLONOSCOPY WITH PROPOFOL;  Surgeon: Jonathon Bellows, MD;  Location: [facility] ENDOSCOPY;  Service: Gastroenterology;  Laterality: N/A;  . TONSILLECTOMY    . TUBAL LIGATION    . UTERINE FIBROID EMBOLIZATION      Prior to Admission medications   Medication Sig Start Date End Date Taking? Authorizing Provider  albuterol (VENTOLIN HFA) 108 (90 Base) MCG/ACT inhaler Inhale 2-4 puffs by mouth every 4 hours as needed for wheezing, cough, and/or shortness of breath 08/29/20   Hinda Kehr, MD  hydrochlorothiazide (HYDRODIURIL) 25 MG tablet TAKE 1 TABLET BY MOUTH EVERY DAY 06/24/19   Hubbard Hartshorn, FNP  ondansetron (ZOFRAN ODT) 4 MG disintegrating tablet Allow 1-2 tablets to dissolve in your mouth every 8 hours as needed for nausea/vomiting 08/29/20   Hinda Kehr, MD  predniSONE (DELTASONE) 10 MG tablet Take 6 tabs (60 mg) PO x 3 days, then take 4 tabs (40 mg) PO x 3 days, then take 2 tabs (20 mg) PO x 3 days, then take 1 tab (10 mg) PO x 3 days, then take 1/2 tab (5 mg) PO x 4 days. 08/29/20   Hinda Kehr, MD    Allergies Dust mite extract, Molds & smuts, and Shellfish allergy  Family History  Problem Relation Age of Onset  . Breast cancer Paternal Aunt 72  . Hypertension Mother   . Breast cancer Cousin 6    Social  History Social History   Tobacco Use  . Smoking status: Former Smoker    Packs/day: 1.00    Years: 14.00    Pack years: 14.00    Types: Cigarettes    Quit date: 1994    Years since quitting: 27.9  . Smokeless tobacco: Never Used  Vaping Use  . Vaping Use: Never used  Substance Use Topics  . Alcohol use: Yes    Alcohol/week: 3.0 standard drinks    Types: 3 Glasses of wine per week  . Drug use: No    Review of Systems Constitutional: +fever/chills Eyes: No visual changes. ENT: No sore throat. Cardiovascular: Denies chest pain. Respiratory: + Shortness of  breath and cough Gastrointestinal: No abdominal pain.  Nausea, no vomiting.  No diarrhea.  No constipation. Genitourinary: Negative for dysuria. Musculoskeletal: Generalized body aches.  Negative for neck pain.  Negative for back pain. Integumentary: Negative for rash. Neurological: Negative for headaches, focal weakness or numbness.   ____________________________________________   PHYSICAL EXAM:  VITAL SIGNS: ED Triage Vitals  Enc Vitals Group     BP 08/28/20 1842 119/82     Pulse Rate 08/28/20 1840 81     Resp 08/28/20 1840 18     Temp 08/28/20 1840 99.3 F (37.4 C)     Temp Source 08/28/20 1840 Oral     SpO2 08/28/20 1840 94 %     Weight 08/28/20 1842 87.1 kg (192 lb)     Height 08/28/20 1842 1.575 m (5\' 2" )     Head Circumference --      Peak Flow --      Pain Score 08/28/20 1841 0     Pain Loc --      Pain Edu? --      Excl. in [place]? --     Constitutional: Alert and oriented.  Appears uncomfortable but nontoxic. Eyes: Conjunctivae are normal.  Head: Atraumatic. Nose: No congestion/rhinnorhea. Mouth/Throat: Patient is wearing a mask. Neck: No stridor.  No meningeal signs.   Cardiovascular: Normal rate, regular rhythm. Good peripheral circulation. Grossly normal heart sounds. Respiratory: Mild tachypnea but no accessory muscle usage and clear lung sounds with good air movement throughout.  No wheezing.  Speaking without difficulty in full sentences. Gastrointestinal: Soft and nontender. No distention.  Musculoskeletal: No lower extremity tenderness nor edema. No gross deformities of extremities. Neurologic:  Normal speech and language. No gross focal neurologic deficits are appreciated.  Skin:  Skin is warm, dry and intact. Psychiatric: Mood and affect are normal. Speech and behavior are normal.  ____________________________________________   LABS (all labs ordered are listed, but only abnormal results are displayed)  Labs Reviewed  RESP PANEL BY RT-PCR (FLU A&B,  COVID) ARPGX2 - Abnormal; Notable for the following components:      Result Value   SARS Coronavirus 2 by RT PCR POSITIVE (*)    All other components within normal limits  CBC - Abnormal; Notable for the following components:   Platelets 133 (*)    All other components within normal limits  BASIC METABOLIC PANEL - Abnormal; Notable for the following components:   Glucose, Bld 143 (*)    All other components within normal limits   ____________________________________________  EKG  ED ECG REPORT I, Hinda Kehr, the attending physician, personally viewed and interpreted this ECG.  Date: 08/28/2020 EKG Time: 18:38 Rate: 89 Rhythm: sinus rhythm with sinus arrhythmia QRS Axis: normal Intervals: normal ST/T Wave abnormalities: Non-specific ST segment / T-wave changes, but no clear evidence  of acute ischemia.  Most notable are the inverted T waves in lead III, but otherwise it is unremarkable. Narrative Interpretation: no definitive evidence of acute ischemia; does not meet STEMI criteria.   ____________________________________________  RADIOLOGY I, Hinda Kehr, personally viewed and evaluated these images (plain radiographs) as part of my medical decision making, as well as reviewing the written report by the radiologist.  ED MD interpretation: Patchy opacities consistent with COVID-19 diagnosis.  Official radiology report(s): DG Chest 2 View  Result Date: 08/28/2020 CLINICAL DATA:  Shortness of breath EXAM: CHEST - 2 VIEW COMPARISON:  09/19/2015 FINDINGS: Possible patchy opacities at the bases. Normal heart size. No pneumothorax. IMPRESSION: Possible patchy opacities/pneumonia at the bases. Electronically Signed   By: Donavan Foil M.D.   On: 08/28/2020 19:24    ____________________________________________   PROCEDURES   Procedure(s) performed (including Critical Care):  Procedures   ____________________________________________   INITIAL IMPRESSION / MDM /  [place] / ED COURSE  As part of my medical decision making, I reviewed the following data within the [place] notes reviewed and incorporated, Labs reviewed , EKG interpreted , Old chart reviewed, Radiograph reviewed  and Notes from prior ED visits   Differential diagnosis includes, but is not limited to, COVID-19, community-acquired pneumonia, other nonspecific viral respiratory infection.  COVID-19 PCR is positive.  Vital signs are stable and within normal limits including her pulse oximeter which is 99 to 100% on room air.  No sign of ischemia on EKG.  I personally reviewed the patient's imaging and agree with the radiologist's interpretation that there is some patchy airspace opacities consistent with her COVID-19 diagnosis.  Basic metabolic panel and CBC are both within normal limits.  I explained to the patient that although she is Covid positive and feeling ill, she does not meet criteria for admission.  I had my usual customary outpatient COVID-19 discussion including management recommendations and return precautions.  I also told her about the monoclonal antibody infusion clinic and I sent a secure chat message through [facility] to the clinic so that they can touch base with her and determine if she would be eligible or available for a monoclonal antibody treatment, in which she indicated interest if it was offered to her.  She understands and agrees with the plan for outpatient management and knows to return to the ED if she gets worse.  Prescriptions as listed below; I am giving her an albuterol inhaler prescription for comfort for her cough and to help with bronchodilation, Zofran for the nausea as I encouraged her to try to stay hydrated with plenty of fluids, and I prescribed a prednisone taper given that she does have some chest x-ray changes in the hopes that this may help prevent substantial worsening of her airway disease.            ____________________________________________  FINAL CLINICAL IMPRESSION(S) / ED DIAGNOSES  Final diagnoses:  COVID-19     MEDICATIONS GIVEN DURING THIS VISIT:  Medications  ondansetron (ZOFRAN) tablet 4 mg (4 mg Oral Given 08/28/20 1905)     ED Discharge Orders         Ordered    albuterol (VENTOLIN HFA) 108 (90 Base) MCG/ACT inhaler       Note to Pharmacy: Pharmacy may substitute brand and size for insurance-approved equivalent   08/29/20 0232    predniSONE (DELTASONE) 10 MG tablet        08/29/20 0232    ondansetron (ZOFRAN ODT) 4  MG disintegrating tablet        08/29/20 0232          *Please note:  Sandra Lin was evaluated in Emergency Department on 08/29/2020 for the symptoms described in the history of present illness. She was evaluated in the context of the global COVID-19 pandemic, which necessitated consideration that the patient might be at risk for infection with the SARS-CoV-2 virus that causes COVID-19. Institutional protocols and algorithms that pertain to the evaluation of patients at risk for COVID-19 are in a state of rapid change based on information released by regulatory bodies including the CDC and federal and state organizations. These policies and algorithms were followed during the patient's care in the ED.  Some ED evaluations and interventions may be delayed as a result of limited staffing during and after the pandemic.*  Note:  This document was prepared using Dragon voice recognition software and may include unintentional dictation errors.   Hinda Kehr, MD 08/29/20 [phone]

## 2020-08-29 NOTE — Telephone Encounter (Signed)
Called to Discuss with patient about Covid symptoms and the use of the monoclonal antibody infusion for those with mild to moderate Covid symptoms and at a high risk of hospitalization.     Pt appears to qualify for this infusion due to co-morbid conditions and/or a member of an at-risk group in accordance with the FDA Emergency Use Authorization due to SVI, BMI, HTN, and history of stroke. Symptom onset 11/30.    Patient declined to receive infusion at this time.  She was amenable to accepting the [place] hotline phone number and will call if she changes her mind.  Encouraged her to call within the next day or 2 due to needing to be within 10 days of symptom onset to receive infusion.

## 2020-09-06 DIAGNOSIS — Z20822 Contact with and (suspected) exposure to covid-19: Secondary | ICD-10-CM | POA: Diagnosis not present

## 2020-09-11 DIAGNOSIS — N390 Urinary tract infection, site not specified: Secondary | ICD-10-CM | POA: Diagnosis not present

## 2020-09-11 DIAGNOSIS — R300 Dysuria: Secondary | ICD-10-CM | POA: Diagnosis not present

## 2020-09-11 DIAGNOSIS — I10 Essential (primary) hypertension: Secondary | ICD-10-CM | POA: Diagnosis not present

## 2020-11-21 IMAGING — MG DIGITAL DIAGNOSTIC BILAT W/ TOMO W/ CAD
6 of 10 series · 6 of 30 positions shown · non-contrast
Comparison: Previous exam(s).

CLINICAL DATA: Palpable area of the LEFT inferior breast and upper
abdomen.

EXAM:
DIGITAL DIAGNOSTIC BILATERAL MAMMOGRAM WITH TOMO AND CAD; ULTRASOUND
LEFT BREAST LIMITED

[L MLO synth-2D (1 of 2)]
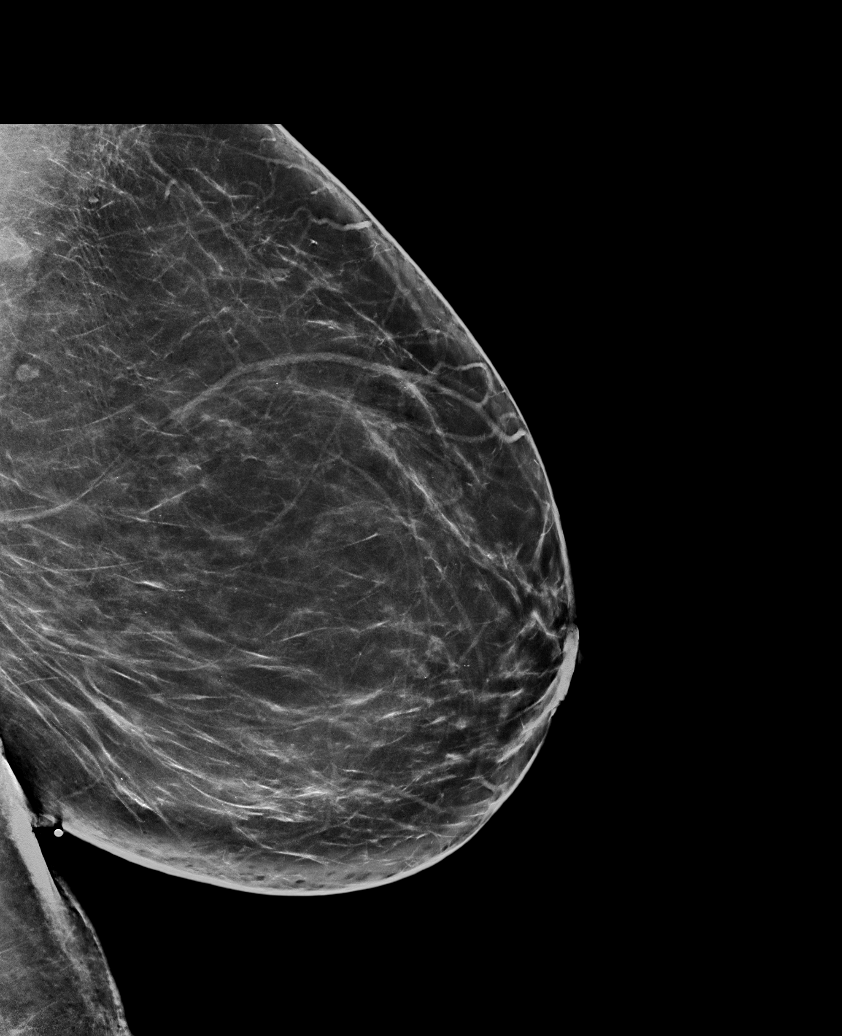

[R CC synth-2D]
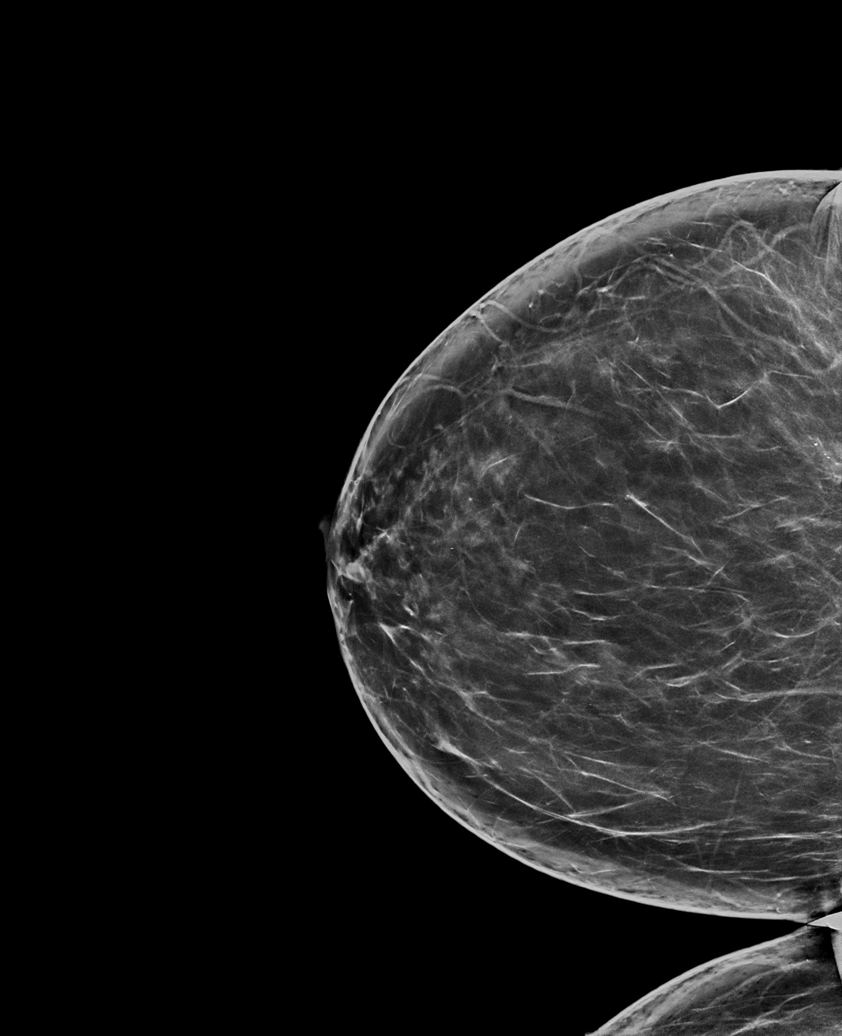

[L MLO synth-2D (2 of 2)]
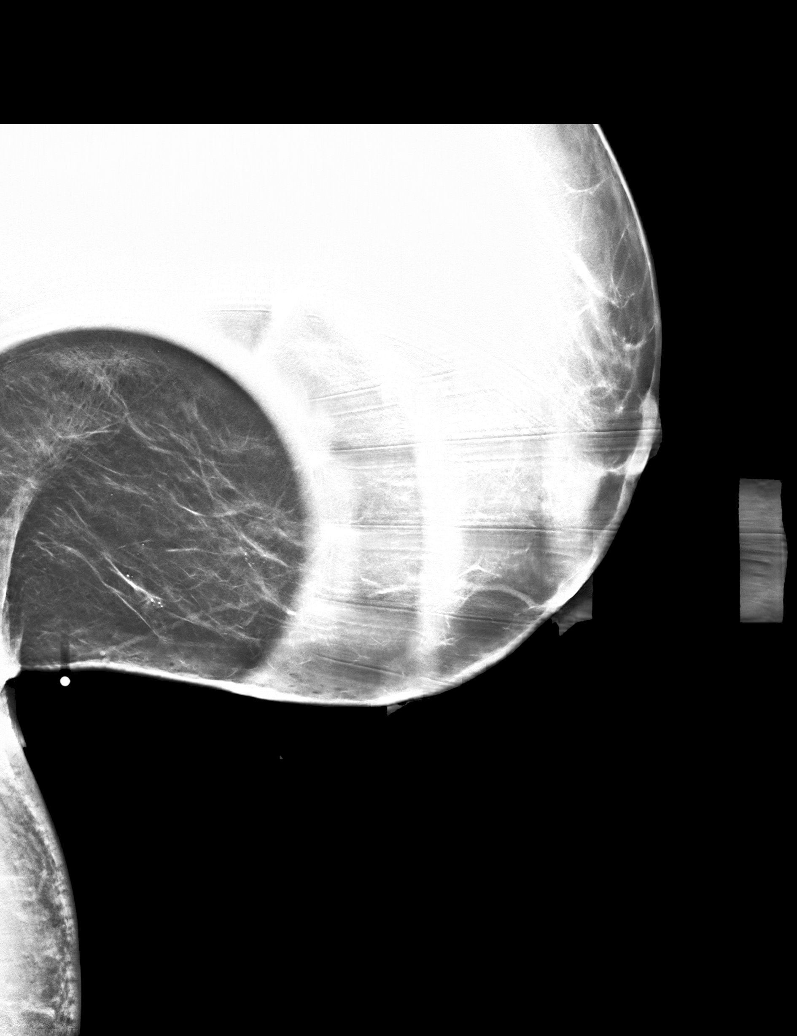

[L CC synth-2D]
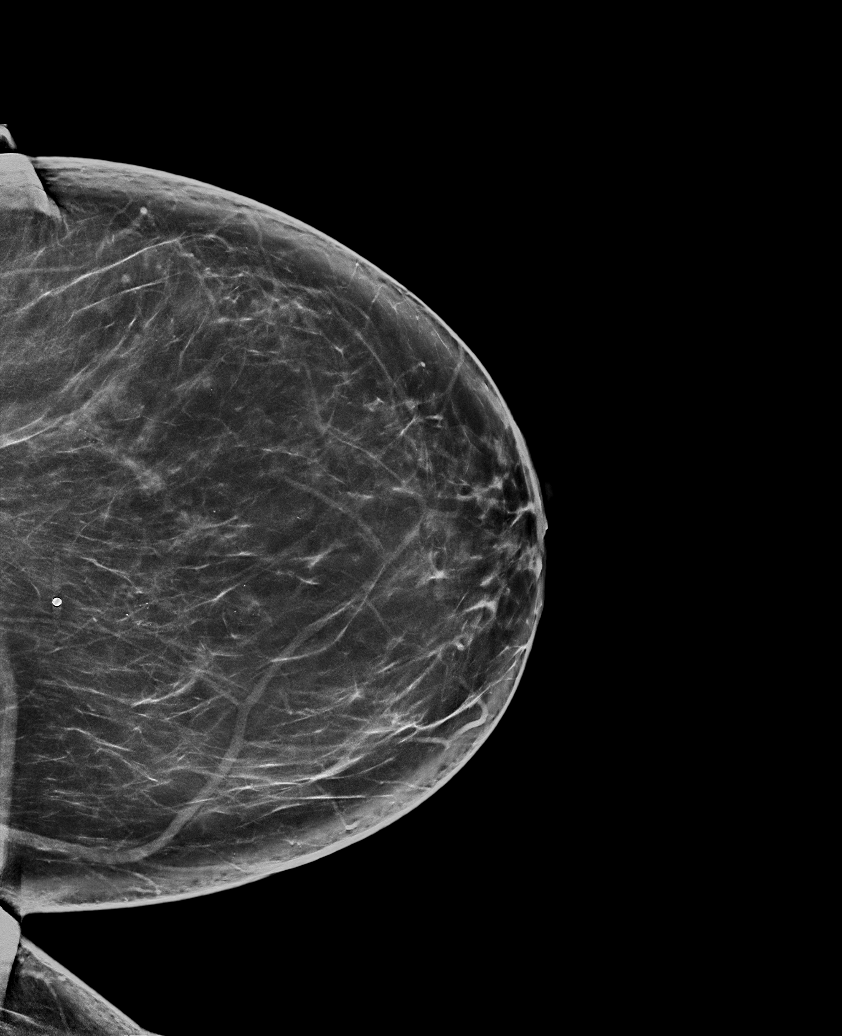

[R MLO synth-2D]
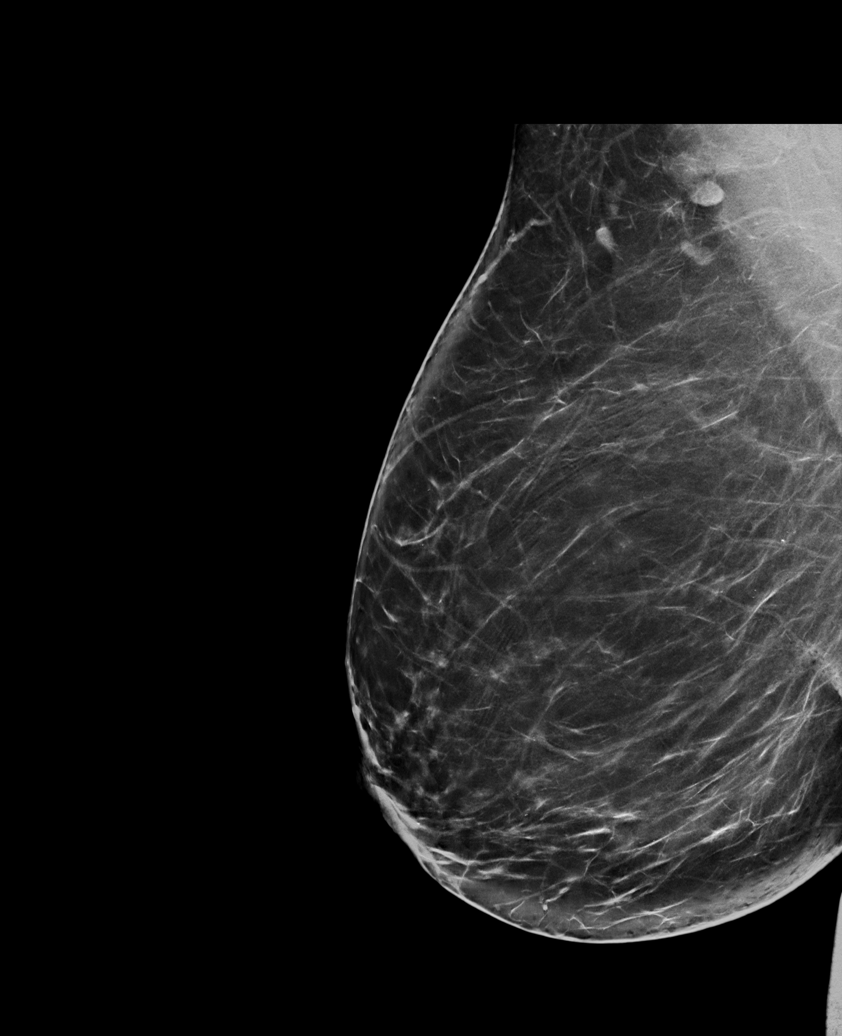

[L MLO tomo · tomo slice 41/82.0]
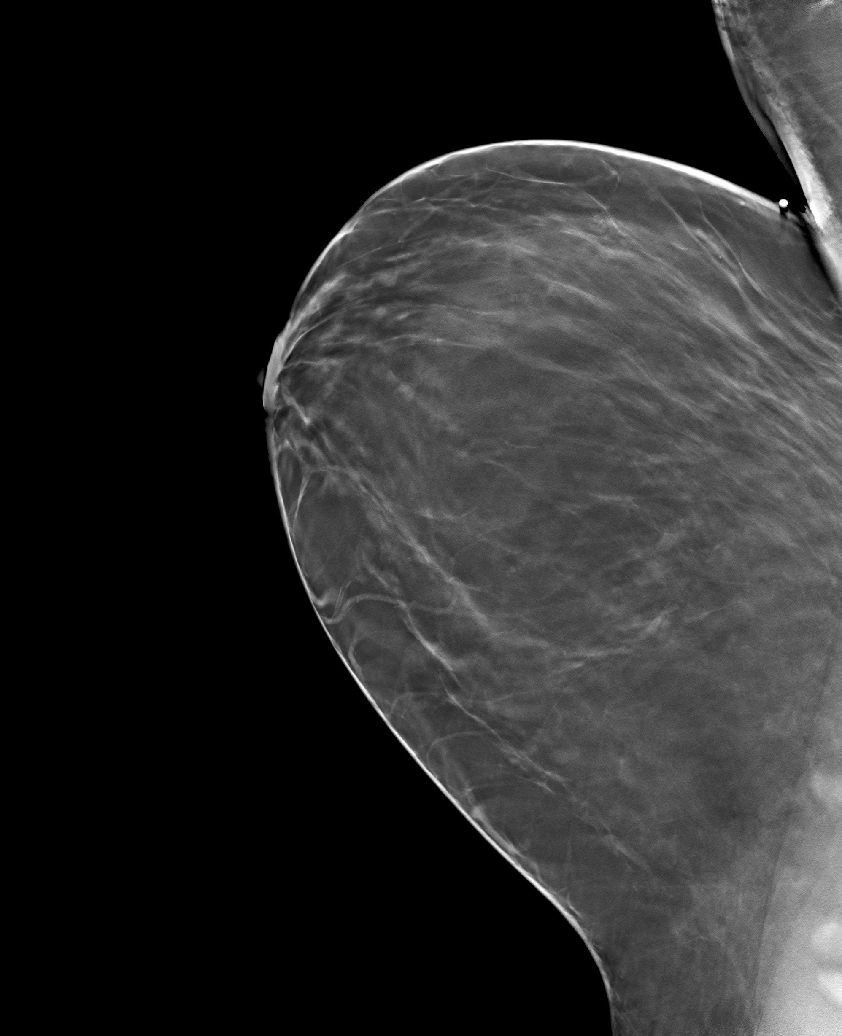

[6 of 30 positions shown; findings below may reference images not displayed]

ACR Breast Density Category b: There are scattered areas of
fibroglandular density.
FINDINGS: Spot compression was obtained over the palpable area of concern in
the LEFT breast. No suspicious mammographic finding is identified in
this area. No suspicious mass, microcalcification, or other finding
is identified in either breast.

Mammographic images were processed with CAD.

Targeted LEFT breast ultrasound was performed in the palpable area
of concern at the lower breast and upper abdomen. No suspicious
solid or cystic mass is identified.
IMPRESSION: 1. No mammographic or sonographic evidence of malignancy at the
palpable concern in the LEFT inferior breast.
2. No mammographic evidence of malignancy bilaterally.

RECOMMENDATION:
Screening mammogram in one year.(Code:AH-[id number])

I have discussed the findings and recommendations with the patient.
If applicable, a reminder letter will be sent to the patient
regarding the next appointment.

BI-RADS CATEGORY  1: Negative.

## 2021-01-22 NOTE — Progress Notes (Signed)
Name: Sandra Lin   MRN: [id number]    DOB: [birth year]   Date:01/23/2021       Progress Note  Subjective  Chief Complaint  Foot Pain/ ENT Referral  HPI  Hearing loss: she was seen last year but never got to see ENT, explained that referral was placed but they could not get a hold of her, I will place a new referral but also give him the phone number for ENT  Right foot pain: she states it has been going on for at least one year , it is intermittent, she is not sure what triggers symptoms but seems to work mostly while at work ( stands up all day at [employer] ), no redness or increase in warmth, but gets swollen. She takes Tylenol and use a topical medication and symptoms. Lasts at most two days.   HTN: she states she only takes HCTZ occasionally, she checks bp and it was high last week and has been taking HCTZ for the past few days, explained importance of taking medication daily, discussed risk of complications when bp not treated.   Dyslipidemia: she took a statin in the past but stopped because she noticed that her eyes were getting yellow and stopped taking it   The 10-year ASCVD risk score Mikey Bussing DC Brooke Bonito., et al., 2013) is: 2.3%   Values used to calculate the score:     Age: 55 years     Sex: Female     Is Non-Hispanic African American: No     Diabetic: No     Tobacco smoker: No     Systolic Blood Pressure: 856 mmHg     Is BP treated: Yes     HDL Cholesterol: 62 mg/dL     Total Cholesterol: 239 mg/dL  History of COVID-19: at the time had thrombocytopenia and we will recheck labs  Stress: her husband had COVID-19 around the same time she was sick, he was only released last month, he has sacral decubitus, left side weakness and is getting PT to be able to walk again.   Patient Active Problem List   Diagnosis Date Noted  . Atypical chest pain 11/07/2018  . Memory deficit 10/30/2018  . History of aneurysm 10/30/2018  . Cervical high risk human papillomavirus (HPV) DNA test  positive 07/29/2018  . Bilateral hearing loss 07/29/2018  . Essential hypertension 07/29/2018  . Morbid obesity ([place]) 07/29/2018  . Heart murmur 07/29/2018  . Allergic to shellfish 01/16/2016  . Dyslipidemia 01/16/2016  . Cerebrovascular accident, old 01/16/2016  . H/O iron deficiency anemia 01/16/2016  . Irregular bleeding 01/16/2016  . Lipoma of lower extremity 01/16/2016  . Class 1 obesity due to excess calories with body mass index (BMI) of 31.0 to 31.9 in adult 01/16/2016  . Fibroid 01/16/2016  . Stress incontinence 01/16/2016  . Allergic rhinitis 12/19/2008    Past Surgical History:  Procedure Laterality Date  . COLONOSCOPY WITH PROPOFOL N/A 11/26/2018   Procedure: COLONOSCOPY WITH PROPOFOL;  Surgeon: Jonathon Bellows, MD;  Location: [facility] ENDOSCOPY;  Service: Gastroenterology;  Laterality: N/A;  . TONSILLECTOMY    . TUBAL LIGATION    . UTERINE FIBROID EMBOLIZATION      Family History  Problem Relation Age of Onset  . Breast cancer Paternal Aunt 39  . Hypertension Mother   . Breast cancer Cousin 56    Social History   Tobacco Use  . Smoking status: Former Smoker    Packs/day: 1.00    Years: 14.00  Pack years: 14.00    Types: Cigarettes    Quit date: 104    Years since quitting: 28.3  . Smokeless tobacco: Never Used  Substance Use Topics  . Alcohol use: Yes    Alcohol/week: 3.0 standard drinks    Types: 3 Glasses of wine per week     Current Outpatient Medications:  .  hydrochlorothiazide (HYDRODIURIL) 25 MG tablet, TAKE 1 TABLET BY MOUTH EVERY DAY, Disp: 15 tablet, Rfl: 0  Allergies  Allergen Reactions  . Dust Mite Extract   . Molds & Smuts   . Shellfish Allergy Swelling    And rash    I personally reviewed active problem list, medication list, allergies, family history, social history, health maintenance with the patient/caregiver today.   ROS  Constitutional: Negative for fever or weight change.  Respiratory: Negative for cough and shortness of  breath.   Cardiovascular: Negative for chest pain or palpitations.  Gastrointestinal: Negative for abdominal pain, no bowel changes.  Musculoskeletal: Negative for gait problem or joint swelling.  Skin: Negative for rash.  Neurological: Negative for dizziness or headache.  No other specific complaints in a complete review of systems (except as listed in HPI above).  Objective  Vitals:   01/23/21 1021  BP: 132/86  Pulse: 62  Resp: 16  Temp: 98.1 F (36.7 C)  TempSrc: Oral  SpO2: 99%  Weight: 194 lb (88 kg)  Height: 5\' 2"  (1.575 m)    Body mass index is 35.48 kg/m.  Physical Exam  Constitutional: Patient appears well-developed and well-nourished. Obese  No distress.  HEENT: head atraumatic, normocephalic, pupils equal and reactive to light, ears normal bilaterally, no wax , neck supple Cardiovascular: Normal rate, regular rhythm and normal heart sounds.  No murmur heard. No BLE edema. Pulmonary/Chest: Effort normal and breath sounds normal. No respiratory distress. Abdominal: Soft.  There is no tenderness. Muscular skeletal: mild soreness with palpation of dorsal right foot, discussed shoe wear, monitor and continue topical medications  Psychiatric: Patient has a normal mood and affect. behavior is normal. Judgment and thought content normal.   PHQ2/9: Depression screen [facility] 2/9 01/23/2021 04/12/2020 03/28/2020 10/30/2018 07/29/2018  Decreased Interest 0 0 0 0 0  Down, Depressed, Hopeless 0 0 0 0 0  PHQ - 2 Score 0 0 0 0 0  Altered sleeping - 0 0 0 0  Tired, decreased energy - 0 0 0 0  Change in appetite - 0 0 0 0  Feeling bad or failure about yourself  - 0 0 0 0  Trouble concentrating - 0 0 0 0  Moving slowly or fidgety/restless - 0 0 0 0  Suicidal thoughts - 0 0 0 0  PHQ-9 Score - 0 0 0 0  Difficult doing work/chores - Not difficult at all Not difficult at all Not difficult at all Not difficult at all    phq 9 is negative   Fall Risk: Fall Risk  01/23/2021 04/12/2020  03/28/2020 10/30/2018 07/29/2018  Falls in the past year? 0 0 0 0 0  Number falls in past yr: 0 0 0 0 -  Injury with Fall? 0 0 0 0 -  Follow up - Falls evaluation completed - Falls evaluation completed -     Functional Status Survey: Is the patient deaf or have difficulty hearing?: Yes Does the patient have difficulty seeing, even when wearing glasses/contacts?: No Does the patient have difficulty concentrating, remembering, or making decisions?: No Does the patient have difficulty walking or climbing stairs?:  No Does the patient have difficulty dressing or bathing?: No Does the patient have difficulty doing errands alone such as visiting a doctor's office or shopping?: No   Assessment & Plan  1. Thrombocytopenia (HCC)  - CBC with Differential/Platelet  2. Essential hypertension  - hydrochlorothiazide (HYDRODIURIL) 25 MG tablet; Take 1 tablet (25 mg total) by mouth daily.  Dispense: 90 tablet; Refill: 1 - COMPLETE METABOLIC PANEL WITH GFR  3. Dyslipidemia  - Lipid panel  4. Bilateral hearing loss, unspecified hearing loss type  - Ambulatory referral to ENT  5. Hyperglycemia  - Hemoglobin A1c   6. Right foot pain

## 2021-01-23 ENCOUNTER — Other Ambulatory Visit: Payer: Self-pay

## 2021-01-23 ENCOUNTER — Ambulatory Visit (INDEPENDENT_AMBULATORY_CARE_PROVIDER_SITE_OTHER): Payer: 59 | Admitting: Family Medicine

## 2021-01-23 ENCOUNTER — Encounter: Payer: Self-pay | Admitting: Family Medicine

## 2021-01-23 VITALS — BP 132/86 | HR 62 | Temp 98.1°F | Resp 16 | Ht 62.0 in | Wt 194.0 lb

## 2021-01-23 DIAGNOSIS — E785 Hyperlipidemia, unspecified: Secondary | ICD-10-CM

## 2021-01-23 DIAGNOSIS — R739 Hyperglycemia, unspecified: Secondary | ICD-10-CM | POA: Diagnosis not present

## 2021-01-23 DIAGNOSIS — I10 Essential (primary) hypertension: Secondary | ICD-10-CM

## 2021-01-23 DIAGNOSIS — M79671 Pain in right foot: Secondary | ICD-10-CM | POA: Diagnosis not present

## 2021-01-23 DIAGNOSIS — D696 Thrombocytopenia, unspecified: Secondary | ICD-10-CM | POA: Diagnosis not present

## 2021-01-23 DIAGNOSIS — H9193 Unspecified hearing loss, bilateral: Secondary | ICD-10-CM | POA: Diagnosis not present

## 2021-01-23 MED ORDER — HYDROCHLOROTHIAZIDE 25 MG PO TABS: 1.0000 | ORAL_TABLET | Freq: Every day | ORAL | 1 refills | Status: DC

## 2021-01-23 NOTE — Patient Instructions (Addendum)
Referral has been sent to [facility]  P: [phone] F: [phone]  Discussed shingrix vaccine

## 2021-01-24 LAB — LIPID PANEL
Cholesterol: 260 mg/dL — ABNORMAL HIGH (ref <200)
HDL: 70 mg/dL (ref >=50)
LDL Cholesterol (Calc): 171 mg/dL (calc) — ABNORMAL HIGH
Non-HDL Cholesterol (Calc): 190 mg/dL (calc) — ABNORMAL HIGH (ref <130)
Total CHOL/HDL Ratio: 3.7 (calc) (ref <5.0)
Triglycerides: 83 mg/dL (ref <150)

## 2021-01-24 LAB — COMPLETE METABOLIC PANEL WITH GFR
AG Ratio: 1.5 (calc) (ref 1.0–2.5)
ALT: 21 U/L (ref 6–29)
AST: 17 U/L (ref 10–35)
Albumin: 4.4 g/dL (ref 3.6–5.1)
Alkaline phosphatase (APISO): 67 U/L (ref 37–153)
BUN: 13 mg/dL (ref 7–25)
CO2: 34 mmol/L — ABNORMAL HIGH (ref 20–32)
Calcium: 10.2 mg/dL (ref 8.6–10.4)
Chloride: 102 mmol/L (ref 98–110)
Creat: 0.77 mg/dL (ref 0.50–1.05)
GFR, Est African American: 103 mL/min/{1.73_m2} (ref >=60)
GFR, Est Non African American: 89 mL/min/{1.73_m2} (ref >=60)
Globulin: 3 g/dL (calc) (ref 1.9–3.7)
Glucose, Bld: 96 mg/dL (ref 65–99)
Potassium: 5 mmol/L (ref 3.5–5.3)
Sodium: 143 mmol/L (ref 135–146)
Total Bilirubin: 0.6 mg/dL (ref 0.2–1.2)
Total Protein: 7.4 g/dL (ref 6.1–8.1)

## 2021-01-24 LAB — HEMOGLOBIN A1C
Hgb A1c MFr Bld: 5.9 % of total Hgb — ABNORMAL HIGH (ref <5.7)
Mean Plasma Glucose: 123 mg/dL
eAG (mmol/L): 6.8 mmol/L

## 2021-01-24 LAB — CBC WITH DIFFERENTIAL/PLATELET
Absolute Monocytes: 508 cells/uL (ref 200–950)
Basophils Absolute: 33 cells/uL (ref 0–200)
Basophils Relative: 0.5 %
Eosinophils Absolute: 178 cells/uL (ref 15–500)
Eosinophils Relative: 2.7 %
HCT: 40.1 % (ref 35.0–45.0)
Hemoglobin: 13.8 g/dL (ref 11.7–15.5)
Lymphs Abs: 2284 cells/uL (ref 850–3900)
MCH: 31 pg (ref 27.0–33.0)
MCHC: 34.4 g/dL (ref 32.0–36.0)
MCV: 90.1 fL (ref 80.0–100.0)
MPV: 11.3 fL (ref 7.5–12.5)
Monocytes Relative: 7.7 %
Neutro Abs: 3597 cells/uL (ref 1500–7800)
Neutrophils Relative %: 54.5 %
Platelets: 204 10*3/uL (ref 140–400)
RBC: 4.45 10*6/uL (ref 3.80–5.10)
RDW: 12.9 % (ref 11.0–15.0)
Total Lymphocyte: 34.6 %
WBC: 6.6 10*3/uL (ref 3.8–10.8)

## 2021-02-14 DIAGNOSIS — H903 Sensorineural hearing loss, bilateral: Secondary | ICD-10-CM | POA: Diagnosis not present

## 2021-02-14 DIAGNOSIS — H6983 Other specified disorders of Eustachian tube, bilateral: Secondary | ICD-10-CM | POA: Diagnosis not present

## 2021-02-14 DIAGNOSIS — H90A32 Mixed conductive and sensorineural hearing loss, unilateral, left ear with restricted hearing on the contralateral side: Secondary | ICD-10-CM | POA: Diagnosis not present

## 2021-02-14 DIAGNOSIS — J301 Allergic rhinitis due to pollen: Secondary | ICD-10-CM | POA: Diagnosis not present

## 2021-07-29 ENCOUNTER — Other Ambulatory Visit: Payer: Self-pay | Admitting: Family Medicine

## 2021-07-29 DIAGNOSIS — I10 Essential (primary) hypertension: Secondary | ICD-10-CM

## 2021-07-29 NOTE — Telephone Encounter (Signed)
Requested medication (s) are due for refill today: yes  Requested medication (s) are on the active medication list: yes  Last refill:  01/23/21  Future visit scheduled: no  Notes to clinic:  called pt and she was driving- pt stated she will call tomorrow to make appt.   Requested Prescriptions  Pending Prescriptions Disp Refills   hydrochlorothiazide (HYDRODIURIL) 25 MG tablet [Pharmacy Med Name: HYDROCHLOROTHIAZIDE 25 MG TAB] 90 tablet 1    Sig: Take 1 tablet (25 mg total) by mouth daily.     Cardiovascular: Diuretics - Thiazide Failed - 07/29/2021 11:04 AM      Failed - Valid encounter within last 6 months    Recent Outpatient Visits           6 months ago Thrombocytopenia [facility])   [facility] [place], Drue Stager, MD   1 year ago Dysfunction of both eustachian tubes   [facility] Delsa Grana, PA-C   1 year ago Essential hypertension   [facility] Delsa Grana, PA-C   2 years ago Essential hypertension   [place], Astrid Divine, FNP   3 years ago Cervical high risk human papillomavirus (HPV) DNA test positive   [facility] [place], Astrid Divine, [place]              Passed - Ca in normal range and within 360 days    Calcium  Date Value Ref Range Status  01/23/2021 10.2 8.6 - 10.4 mg/dL Final   Calcium, Total  Date Value Ref Range Status  05/23/2013 9.2 8.5 - 10.1 mg/dL Final          Passed - Cr in normal range and within 360 days    Creat  Date Value Ref Range Status  01/23/2021 0.77 0.50 - 1.05 mg/dL Final    Comment:    For patients >68 years of age, the reference limit for Creatinine is approximately 13% higher for people identified as African-American. .           Passed - K in normal range and within 360 days    Potassium  Date Value Ref Range Status  01/23/2021 5.0 3.5 - 5.3 mmol/L Final  05/23/2013 3.4 (L) 3.5 - 5.1 mmol/L Final          Passed - Na in  normal range and within 360 days    Sodium  Date Value Ref Range Status  01/23/2021 143 135 - 146 mmol/L Final  05/23/2013 138 136 - 145 mmol/L Final          Passed - Last BP in normal range    BP Readings from Last 1 Encounters:  01/23/21 132/86

## 2021-07-30 NOTE — Telephone Encounter (Signed)
Lvm to have pt call and schedule an appt

## 2023-02-26 ENCOUNTER — Ambulatory Visit: Payer: 59 | Admitting: Physician Assistant

## 2023-02-26 ENCOUNTER — Encounter: Payer: Self-pay | Admitting: Physician Assistant

## 2023-02-26 VITALS — BP 138/75 | HR 66 | Temp 97.8°F | Resp 20 | Ht 65.0 in | Wt 195.8 lb

## 2023-02-26 DIAGNOSIS — E162 Hypoglycemia, unspecified: Secondary | ICD-10-CM

## 2023-02-26 DIAGNOSIS — H9193 Unspecified hearing loss, bilateral: Secondary | ICD-10-CM

## 2023-02-26 DIAGNOSIS — R87810 Cervical high risk human papillomavirus (HPV) DNA test positive: Secondary | ICD-10-CM

## 2023-02-26 DIAGNOSIS — R002 Palpitations: Secondary | ICD-10-CM | POA: Diagnosis not present

## 2023-02-26 DIAGNOSIS — R739 Hyperglycemia, unspecified: Secondary | ICD-10-CM | POA: Diagnosis not present

## 2023-02-26 DIAGNOSIS — I10 Essential (primary) hypertension: Secondary | ICD-10-CM | POA: Diagnosis not present

## 2023-02-26 DIAGNOSIS — R011 Cardiac murmur, unspecified: Secondary | ICD-10-CM | POA: Diagnosis not present

## 2023-02-26 DIAGNOSIS — Z8673 Personal history of transient ischemic attack (TIA), and cerebral infarction without residual deficits: Secondary | ICD-10-CM

## 2023-02-26 DIAGNOSIS — Z1231 Encounter for screening mammogram for malignant neoplasm of breast: Secondary | ICD-10-CM

## 2023-02-26 DIAGNOSIS — E785 Hyperlipidemia, unspecified: Secondary | ICD-10-CM

## 2023-02-26 DIAGNOSIS — Z1211 Encounter for screening for malignant neoplasm of colon: Secondary | ICD-10-CM

## 2023-02-26 DIAGNOSIS — Z862 Personal history of diseases of the blood and blood-forming organs and certain disorders involving the immune mechanism: Secondary | ICD-10-CM

## 2023-02-26 LAB — POCT GLUCOSE (DEVICE FOR HOME USE): POC Glucose: 101 mg/dl — AB (ref 70–99)

## 2023-02-26 MED ORDER — HYDROCHLOROTHIAZIDE 25 MG PO TABS: 25.0000 mg | ORAL_TABLET | Freq: Every day | ORAL | 1 refills | Status: DC

## 2023-02-26 NOTE — Progress Notes (Signed)
I,[name],acting as a Neurosurgeon for [employer], PA-C.,have documented all relevant documentation on the behalf of Sandra Ferguson, PA-C,as directed by  Sandra Ferguson, PA-C while in the presence of Sandra Ferguson, PA-C.   New patient visit   Patient: Sandra Lin   DOB: [birth year]   57 y.o. Female  MRN: [id number] Visit Date: 02/26/2023  Today's healthcare provider: [name], PA-C   Chief Complaint  Patient presents with   New Patient (Initial Visit)   Subjective    Sandra Lin is a 57 y.o. female who presents today as a new patient to establish care.  HPI  Discussed the use of AI scribe software for clinical note transcription with the patient, who gave verbal consent to proceed.  History of Present Illness   The patient, with a history of hemorrhagic stroke 2/2 aneurysm and hypertension, presents with occasional headaches which occur about once or twice a month. Describes headaches across the front of her head or the back of her head. Usually go away with OTC pain medication.   The patient also reports feeling intermittently shaky and sweaty, which she attributes to potential symptoms of hypoglycemia. Improved if she eats something or takes a glucose tab.   The patient is not consistent with taking hydrochlorothiazide, a medication for high blood pressure, and only takes it when feeling off or noticing swelling in the ankles or feet. She does not check her blood pressure at home.   The patient reports significant hearing loss in her left ear and has been recommended a hearing aid in the past. This was at least three years ago. she is concerned she has too much earwax in her ears.    The patient also reports occasional palpitations and a tightening sensation in the chest, which sometimes leads to a feeling of breathlessness. Reports happens if she is reaching sometimes, or at rest.     The patient has a history of HPV and is due for a Pap smear,  colonoscopy, and mammogram, but is hesitant about these procedures due to insurance concerns.       Pt reports her stroke has no neuro deficits, no disability left from incident. Reports being cleared from neurology. Thinks the stroke occurred in 2013.   Past Medical History:  Diagnosis Date   Allergy    Anxiety    Hypertension    Migraine    Stroke [facility])    Subarachnoid hematoma (HCC) 2013   Past Surgical History:  Procedure Laterality Date   COLONOSCOPY WITH PROPOFOL N/A 11/26/2018   Procedure: COLONOSCOPY WITH PROPOFOL;  Surgeon: Wyline Mood, MD;  Location: [facility] ENDOSCOPY;  Service: Gastroenterology;  Laterality: N/A;   TONSILLECTOMY     TUBAL LIGATION     UTERINE FIBROID EMBOLIZATION     Family Status  Relation Name Status   Mother  Alive   Emelda Brothers  (Not Specified)   Cousin  (Not Specified)   Family History  Problem Relation Age of Onset   Anxiety disorder Mother    Hypertension Mother    Breast cancer Paternal Aunt 72   Breast cancer Cousin 63   Social History   Socioeconomic History   Marital status: Married    Spouse name: Not on file   Number of children: 5   Years of education: Not on file   Highest education level: Not on file  Occupational History   Not on file  Tobacco Use   Smoking status: Former    Packs/day:  1.00    Years: 14.00    Additional pack years: 0.00    Total pack years: 14.00    Types: Cigarettes    Quit date: 23    Years since quitting: 30.4   Smokeless tobacco: Never  Vaping Use   Vaping Use: Never used  Substance and Sexual Activity   Alcohol use: Yes    Alcohol/week: 3.0 standard drinks of alcohol    Types: 3 Glasses of wine per week   Drug use: No   Sexual activity: Not Currently  Other Topics Concern   Not on file  Social History Narrative   Not on file   Social Determinants of Health   Financial Resource Strain: Low Risk  (07/29/2018)   Overall Financial Resource Strain (CARDIA)    Difficulty of Paying Living  Expenses: Not hard at all  Food Insecurity: No Food Insecurity (07/29/2018)   Hunger Vital Sign    Worried About Running Out of Food in the Last Year: Never true    Ran Out of Food in the Last Year: Never true  Transportation Needs: No Transportation Needs (07/29/2018)   PRAPARE - Administrator, Civil Service (Medical): No    Lack of Transportation (Non-Medical): No  Physical Activity: Inactive (07/29/2018)   Exercise Vital Sign    Days of Exercise per Week: 0 days    Minutes of Exercise per Session: 0 min  Stress: No Stress Concern Present (07/29/2018)   [employer] of Occupational Health - Occupational Stress Questionnaire    Feeling of Stress : Not at all  Social Connections: Moderately Integrated (07/29/2018)   Social Connection and Isolation Panel [NHANES]    Frequency of Communication with Friends and Family: More than three times a week    Frequency of Social Gatherings with Friends and Family: More than three times a week    Attends Religious Services: More than 4 times per year    Active Member of [employer] or Organizations: Yes    Attends Banker Meetings: Never    Marital Status: Never married   Outpatient Medications Prior to Visit  Medication Sig   [DISCONTINUED] hydrochlorothiazide (HYDRODIURIL) 25 MG tablet Take 1 tablet (25 mg total) by mouth daily.   No facility-administered medications prior to visit.   Allergies  Allergen Reactions   Dust Mite Extract    Molds & Smuts    Shellfish Allergy Swelling    And rash    Immunization History  Administered Date(s) Administered   Tdap 04/21/2012    Health Maintenance  Topic Date Due   COVID-19 Vaccine (1) Never done   Hepatitis C Screening  Never done   Zoster Vaccines- Shingrix (1 of 2) Never done   PAP SMEAR-Modifier  12/17/2020   Colonoscopy  11/25/2021   DTaP/Tdap/Td (2 - Td or Tdap) 04/21/2022   MAMMOGRAM  05/04/2022   INFLUENZA VACCINE  04/24/2023   HIV Screening  Completed    HPV VACCINES  Aged Out    Patient Care Team: Doren Custard, FNP as PCP - General (Family Medicine) Jim Like, RN as Registered Nurse Scarlett Presto, RN (Inactive) as Registered Nurse Evalee Mutton, MD (Internal Medicine)  Review of Systems  HENT:  Positive for hearing loss and tinnitus.   Respiratory:  Positive for chest tightness.   Cardiovascular:  Positive for chest pain, palpitations and leg swelling.  Musculoskeletal:  Positive for arthralgias and back pain.  Allergic/Immunologic: Positive for food allergies.  Neurological:  Positive  for dizziness and headaches.  Psychiatric/Behavioral:  The patient is nervous/anxious.   All other systems reviewed and are negative.   Last CBC Lab Results  Component Value Date   WBC 6.6 01/23/2021   HGB 13.8 01/23/2021   HCT 40.1 01/23/2021   MCV 90.1 01/23/2021   MCH 31.0 01/23/2021   RDW 12.9 01/23/2021   PLT 204 01/23/2021   Last metabolic panel Lab Results  Component Value Date   GLUCOSE 96 01/23/2021   NA 143 01/23/2021   K 5.0 01/23/2021   CL 102 01/23/2021   CO2 34 (H) 01/23/2021   BUN 13 01/23/2021   CREATININE 0.77 01/23/2021   GFRNONAA 89 01/23/2021   CALCIUM 10.2 01/23/2021   PROT 7.4 01/23/2021   ALBUMIN 3.7 01/15/2016   BILITOT 0.6 01/23/2021   ALKPHOS 46 01/15/2016   AST 17 01/23/2021   ALT 21 01/23/2021   ANIONGAP 13 08/28/2020   Last lipids Lab Results  Component Value Date   CHOL 260 (H) 01/23/2021   HDL 70 01/23/2021   LDLCALC 171 (H) 01/23/2021   TRIG 83 01/23/2021   CHOLHDL 3.7 01/23/2021   Last hemoglobin A1c Lab Results  Component Value Date   HGBA1C 5.9 (H) 01/23/2021   Last thyroid functions Lab Results  Component Value Date   TSH 1.30 07/29/2018       Objective    BP 138/75 (BP Location: Left Arm, Patient Position: Sitting, Cuff Size: Large)   Pulse 66   Temp 97.8 F (36.6 C) (Temporal)   Resp 20   Ht 5\' 5"  (1.651 m)   Wt 195 lb 12.8 oz (88.8 kg)   SpO2 100%    BMI 32.58 kg/m  BP Readings from Last 3 Encounters:  02/26/23 138/75  01/23/21 132/86  08/29/20 113/76   Wt Readings from Last 3 Encounters:  02/26/23 195 lb 12.8 oz (88.8 kg)  01/23/21 194 lb (88 kg)  08/28/20 192 lb (87.1 kg)    Physical Exam Constitutional:      General: She is awake.     Appearance: She is well-developed.  HENT:     Head: Normocephalic.     Right Ear: Tympanic membrane normal. There is no impacted cerumen.     Left Ear: Tympanic membrane normal. There is no impacted cerumen.  Eyes:     Conjunctiva/sclera: Conjunctivae normal.  Cardiovascular:     Rate and Rhythm: Normal rate and regular rhythm.     Heart sounds: Normal heart sounds.  Pulmonary:     Effort: Pulmonary effort is normal.     Breath sounds: Normal breath sounds.  Skin:    General: Skin is warm.  Neurological:     Mental Status: She is alert and oriented to person, place, and time.  Psychiatric:        Attention and Perception: Attention normal.        Mood and Affect: Mood normal.        Speech: Speech normal.        Behavior: Behavior is cooperative.    Depression Screen    02/26/2023    2:04 PM 01/23/2021   10:20 AM 04/12/2020   10:57 AM 03/28/2020   10:57 AM  PHQ 2/9 Scores  PHQ - 2 Score 0 0 0 0  PHQ- 9 Score 2  0 0   Results for orders placed or performed in visit on 02/26/23  POCT Glucose (Device for Home Use)  Result Value Ref Range   Glucose Fasting, POC  POC Glucose 101 (A) 70 - 99 mg/dl    Assessment & Plan      Problem List Items Addressed This Visit       Cardiovascular and Mediastinum   Essential hypertension - Primary    Borderline htn with inconsistent use of Hydrochlorothiazide. Patient reports taking medication sporadically. -Advise consistent use of Hydrochlorothiazide, taken in the morning. -Recommend home blood pressure monitoring for two weeks to assess need for medication adjustment. -f/u 1 mo in office      Relevant Medications    hydrochlorothiazide (HYDRODIURIL) 25 MG tablet   Other Relevant Orders   Comprehensive Metabolic Panel (CMET)     Endocrine   Hypoglycemia    Hypoglycemia: Reports occasional episodes of shakiness, intense hunger, and sweating. Patient manages symptoms with glucose tablets. -POC glucose 101 today, pt reports feeling the sensation now.  -Will check labs and tsh/t4       Relevant Orders   POCT Glucose (Device for Home Use) (Completed)     Nervous and Auditory   Bilateral hearing loss    Clear TM b/l History of significant hearing loss in one ear, previously recommended for hearing aid -Refer to audiologist for re-evaluation of hearing loss.      Relevant Orders   Ambulatory referral to Audiology     Other   Dyslipidemia    Repeat fasting lipids      Relevant Orders   Comprehensive Metabolic Panel (CMET)   Lipid Profile   Cerebrovascular accident, old    2/2 aneurysm, hemorrhagic stroke. Per pt in 2013.       H/O iron deficiency anemia    Will check cbc      Relevant Orders   CBC w/Diff/Platelet   Cervical high risk human papillomavirus (HPV) DNA test positive    Refer for Pap smear due to history of HPV.      Relevant Orders   Ambulatory referral to Gynecology   Heart murmur    Very mild on exam. EKG today normal sinus      Relevant Orders   EKG 12-Lead   Palpitations    -EKG today normal sinus. Mild murmur heard on exam, echo in 2020 w/ mild regurg no other abnormalities, no f/u indicated. -Advise patient to monitor for triggers and frequency of episodes. Advised appropriate fluid intake -- at least 32 oz , but ideally 64 oz daily.        Relevant Orders   TSH + free T4   EKG 12-Lead   Other Visit Diagnoses     Hyperglycemia       Relevant Orders   HgB A1c   POCT Glucose (Device for Home Use) (Completed)   Colon cancer screening       Relevant Orders   Ambulatory referral to Gastroenterology   Breast cancer screening by mammogram       Relevant  Orders   MM 3D SCREENING MAMMOGRAM BILATERAL BREAST         Preventative Care: Due for Pap smear, colonoscopy, and mammogram. History of HPV. -Schedule colonoscopy and mammogram as per preventative care guidelines. Return in about 4 weeks (around 03/26/2023) for hypertension.     I, Sandra Ferguson, PA-C have reviewed all documentation for this visit. The documentation on  02/26/23   for the exam, diagnosis, procedures, and orders are all accurate and complete.  Sandra Ferguson, PA-C [facility] [address] [place], [place], [address] Office: [phone] Fax: [phone]   [facility]

## 2023-02-26 NOTE — Assessment & Plan Note (Signed)
Will check cbc

## 2023-02-26 NOTE — Assessment & Plan Note (Signed)
Borderline htn with inconsistent use of Hydrochlorothiazide. Patient reports taking medication sporadically. -Advise consistent use of Hydrochlorothiazide, taken in the morning. -Recommend home blood pressure monitoring for two weeks to assess need for medication adjustment. -f/u 1 mo in office

## 2023-02-26 NOTE — Assessment & Plan Note (Signed)
Clear TM b/l History of significant hearing loss in one ear, previously recommended for hearing aid -Refer to audiologist for re-evaluation of hearing loss.

## 2023-02-26 NOTE — Assessment & Plan Note (Signed)
Repeat fasting lipids.

## 2023-02-26 NOTE — Assessment & Plan Note (Signed)
Refer for Pap smear due to history of HPV.

## 2023-02-26 NOTE — Assessment & Plan Note (Signed)
Hypoglycemia: Reports occasional episodes of shakiness, intense hunger, and sweating. Patient manages symptoms with glucose tablets. -POC glucose 101 today, pt reports feeling the sensation now.  -Will check labs and tsh/t4

## 2023-02-26 NOTE — Assessment & Plan Note (Signed)
2/2 aneurysm, hemorrhagic stroke. Per pt in 2013.

## 2023-02-26 NOTE — Assessment & Plan Note (Signed)
Very mild on exam. EKG today normal sinus

## 2023-02-26 NOTE — Assessment & Plan Note (Signed)
-  EKG today normal sinus. Mild murmur heard on exam, echo in 2020 w/ mild regurg no other abnormalities, no f/u indicated. -Advise patient to monitor for triggers and frequency of episodes. Advised appropriate fluid intake -- at least 32 oz , but ideally 64 oz daily.

## 2023-03-04 LAB — COMPREHENSIVE METABOLIC PANEL
ALT: 19 IU/L (ref 0–32)
AST: 14 IU/L (ref 0–40)
Albumin/Globulin Ratio: 1.5
Albumin: 4.2 g/dL (ref 3.8–4.9)
Alkaline Phosphatase: 74 IU/L (ref 44–121)
BUN/Creatinine Ratio: 16 (ref 9–23)
BUN: 12 mg/dL (ref 6–24)
Bilirubin Total: 0.4 mg/dL (ref 0.0–1.2)
CO2: 26 mmol/L (ref 20–29)
Calcium: 9.7 mg/dL (ref 8.7–10.2)
Chloride: 103 mmol/L (ref 96–106)
Creatinine, Ser: 0.73 mg/dL (ref 0.57–1.00)
Globulin, Total: 2.8 g/dL (ref 1.5–4.5)
Glucose: 109 mg/dL — ABNORMAL HIGH (ref 70–99)
Potassium: 4 mmol/L (ref 3.5–5.2)
Sodium: 143 mmol/L (ref 134–144)
Total Protein: 7 g/dL (ref 6.0–8.5)
eGFR: 98 mL/min/{1.73_m2} (ref >59)

## 2023-03-04 LAB — CBC WITH DIFFERENTIAL/PLATELET
Basophils Absolute: 0 10*3/uL (ref 0.0–0.2)
Basos: 1 %
EOS (ABSOLUTE): 0.2 10*3/uL (ref 0.0–0.4)
Eos: 4 %
Hematocrit: 39.8 % (ref 34.0–46.6)
Hemoglobin: 13.3 g/dL (ref 11.1–15.9)
Immature Grans (Abs): 0 10*3/uL (ref 0.0–0.1)
Immature Granulocytes: 0 %
Lymphocytes Absolute: 2.4 10*3/uL (ref 0.7–3.1)
Lymphs: 37 %
MCH: 30.6 pg (ref 26.6–33.0)
MCHC: 33.4 g/dL (ref 31.5–35.7)
MCV: 92 fL (ref 79–97)
Monocytes Absolute: 0.5 10*3/uL (ref 0.1–0.9)
Monocytes: 7 %
Neutrophils Absolute: 3.3 10*3/uL (ref 1.4–7.0)
Neutrophils: 51 %
Platelets: 204 10*3/uL (ref 150–450)
RBC: 4.35 x10E6/uL (ref 3.77–5.28)
RDW: 13.8 % (ref 11.7–15.4)
WBC: 6.4 10*3/uL (ref 3.4–10.8)

## 2023-03-04 LAB — LIPID PANEL
Chol/HDL Ratio: 3.2 ratio (ref 0.0–4.4)
Cholesterol, Total: 220 mg/dL — ABNORMAL HIGH (ref 100–199)
HDL: 68 mg/dL (ref >39)
LDL Chol Calc (NIH): 138 mg/dL — ABNORMAL HIGH (ref 0–99)
Triglycerides: 78 mg/dL (ref 0–149)
VLDL Cholesterol Cal: 14 mg/dL (ref 5–40)

## 2023-03-04 LAB — HEMOGLOBIN A1C
Est. average glucose Bld gHb Est-mCnc: 128 mg/dL
Hgb A1c MFr Bld: 6.1 % — ABNORMAL HIGH (ref 4.8–5.6)

## 2023-03-04 LAB — TSH+FREE T4
Free T4: 1.17 ng/dL (ref 0.82–1.77)
TSH: 2.53 u[IU]/mL (ref 0.450–4.500)

## 2023-04-02 ENCOUNTER — Ambulatory Visit: Payer: 59 | Admitting: Physician Assistant

## 2023-04-08 ENCOUNTER — Encounter: Payer: Self-pay | Admitting: Radiology

## 2023-04-08 ENCOUNTER — Ambulatory Visit
Admission: RE | Admit: 2023-04-08 | Discharge: 2023-04-08 | Disposition: A | Discharge status: Home / Self Care | Payer: 59 | Source: Ambulatory Visit | Attending: Physician Assistant | Admitting: Physician Assistant

## 2023-04-08 DIAGNOSIS — Z1231 Encounter for screening mammogram for malignant neoplasm of breast: Secondary | ICD-10-CM | POA: Insufficient documentation

## 2023-08-26 ENCOUNTER — Other Ambulatory Visit: Payer: Self-pay | Admitting: Physician Assistant

## 2023-08-26 DIAGNOSIS — I10 Essential (primary) hypertension: Secondary | ICD-10-CM

## 2023-09-29 ENCOUNTER — Other Ambulatory Visit: Payer: Self-pay | Admitting: Family Medicine

## 2023-09-29 DIAGNOSIS — I10 Essential (primary) hypertension: Secondary | ICD-10-CM

## 2024-06-01 ENCOUNTER — Other Ambulatory Visit: Payer: Self-pay | Admitting: Family Medicine

## 2024-06-01 DIAGNOSIS — I10 Essential (primary) hypertension: Secondary | ICD-10-CM

## 2024-06-01 NOTE — Telephone Encounter (Signed)
 LOV- 02/26/2023 (Lindsay Drubel) NOV- None LRF- 09/29/2023 Hydrochlorothiazide  25 mg tablets 90 x 1

## 2024-06-25 ENCOUNTER — Telehealth

## 2024-07-19 ENCOUNTER — Other Ambulatory Visit: Payer: Self-pay | Admitting: Family Medicine

## 2024-07-19 DIAGNOSIS — I10 Essential (primary) hypertension: Secondary | ICD-10-CM

## 2024-08-17 ENCOUNTER — Other Ambulatory Visit: Payer: Self-pay | Admitting: Family Medicine

## 2024-08-17 DIAGNOSIS — Z1231 Encounter for screening mammogram for malignant neoplasm of breast: Secondary | ICD-10-CM

## 2024-08-30 ENCOUNTER — Ambulatory Visit
Admission: RE | Admit: 2024-08-30 | Discharge: 2024-08-30 | Disposition: A | Source: Ambulatory Visit | Attending: Family Medicine

## 2024-08-30 DIAGNOSIS — Z1231 Encounter for screening mammogram for malignant neoplasm of breast: Secondary | ICD-10-CM
